# Patient Record
Sex: Male | Born: 1945
Health system: Southern US, Community
[De-identification: ages and names within clinical notes are randomized; demographics above are authoritative.]

## PROBLEM LIST (undated history)

## (undated) DIAGNOSIS — T7840XA Allergy, unspecified, initial encounter: Secondary | ICD-10-CM

## (undated) DIAGNOSIS — B1910 Unspecified viral hepatitis B without hepatic coma: Secondary | ICD-10-CM

## (undated) DIAGNOSIS — H269 Unspecified cataract: Secondary | ICD-10-CM

## (undated) HISTORY — PX: OTHER SURGICAL HISTORY: SHX169

## (undated) HISTORY — DX: Unspecified cataract: H26.9

## (undated) HISTORY — DX: Allergy, unspecified, initial encounter: T78.40XA

## (undated) HISTORY — PX: COLONOSCOPY: SHX174

## (undated) HISTORY — DX: Unspecified viral hepatitis B without hepatic coma: B19.10

## (undated) HISTORY — PX: EYE SURGERY: SHX253

---

## 2004-11-11 ENCOUNTER — Ambulatory Visit: Payer: Self-pay | Admitting: Gastroenterology

## 2004-11-17 ENCOUNTER — Ambulatory Visit: Payer: Self-pay | Admitting: Gastroenterology

## 2005-03-20 ENCOUNTER — Ambulatory Visit: Payer: Self-pay | Admitting: Cardiology

## 2005-04-07 ENCOUNTER — Ambulatory Visit: Payer: Self-pay

## 2008-08-13 ENCOUNTER — Encounter: Admission: RE | Admit: 2008-08-13 | Discharge: 2008-08-13 | Payer: Self-pay | Admitting: Endocrinology

## 2008-12-27 ENCOUNTER — Telehealth (INDEPENDENT_AMBULATORY_CARE_PROVIDER_SITE_OTHER): Payer: Self-pay | Admitting: *Deleted

## 2008-12-31 ENCOUNTER — Encounter: Payer: Self-pay | Admitting: Cardiology

## 2008-12-31 ENCOUNTER — Ambulatory Visit: Payer: Self-pay

## 2009-01-01 ENCOUNTER — Telehealth (INDEPENDENT_AMBULATORY_CARE_PROVIDER_SITE_OTHER): Payer: Self-pay | Admitting: Radiology

## 2009-02-21 DIAGNOSIS — I498 Other specified cardiac arrhythmias: Secondary | ICD-10-CM

## 2009-02-22 ENCOUNTER — Ambulatory Visit: Payer: Self-pay | Admitting: Cardiology

## 2009-02-22 DIAGNOSIS — R079 Chest pain, unspecified: Secondary | ICD-10-CM | POA: Insufficient documentation

## 2009-02-23 ENCOUNTER — Ambulatory Visit: Payer: Self-pay | Admitting: Cardiovascular Disease

## 2009-02-23 ENCOUNTER — Ambulatory Visit (HOSPITAL_COMMUNITY): Admission: RE | Admit: 2009-02-23 | Discharge: 2009-02-23 | Payer: Self-pay | Admitting: Cardiology

## 2009-03-05 LAB — CONVERTED CEMR LAB: CRP: 0.1 mg/dL (ref <0.6)

## 2009-10-01 ENCOUNTER — Encounter (INDEPENDENT_AMBULATORY_CARE_PROVIDER_SITE_OTHER): Payer: Self-pay | Admitting: *Deleted

## 2009-11-01 ENCOUNTER — Encounter (INDEPENDENT_AMBULATORY_CARE_PROVIDER_SITE_OTHER): Payer: Self-pay | Admitting: *Deleted

## 2009-11-14 ENCOUNTER — Encounter (INDEPENDENT_AMBULATORY_CARE_PROVIDER_SITE_OTHER): Payer: Self-pay | Admitting: *Deleted

## 2009-11-15 ENCOUNTER — Ambulatory Visit: Payer: Self-pay | Admitting: Gastroenterology

## 2009-11-22 ENCOUNTER — Ambulatory Visit: Payer: Self-pay | Admitting: Gastroenterology

## 2010-09-30 NOTE — Miscellaneous (Signed)
Summary: previsit/rm  Clinical Lists Changes  Medications: Added new medication of MOVIPREP 100 GM  SOLR (PEG-KCL-NACL-NASULF-NA ASC-C) As per prep instructions. - Signed Rx of MOVIPREP 100 GM  SOLR (PEG-KCL-NACL-NASULF-NA ASC-C) As per prep instructions.;  #1 x 0;  Signed;  Entered by: Sherren Kerns RN;  Authorized by: Mardella Layman MD [facility];  Method used: Electronically to [facility]. [phone]*, [address], [place], [place], [place]  [address], Ph: [phone], Fax: [phone] Observations: Added new observation of ALLERGY REV: Done (11/15/2009 13:04)    Prescriptions: MOVIPREP 100 GM  SOLR (PEG-KCL-NACL-NASULF-NA ASC-C) As per prep instructions.  #1 x 0   Entered by:   Sherren Kerns RN   Authorized by:   Mardella Layman MD [facility]   Signed by:   Sherren Kerns RN on 11/15/2009   Method used:   Electronically to        [facility]. [phone]* (retail)       [address]       [place], [place]  [address]       Ph: [phone]       Fax: [phone]   RxID:   [id number]

## 2010-09-30 NOTE — Procedures (Signed)
Summary: Colonoscopy  Patient: Thomas Balaguer md Note: All result statuses are Final unless otherwise noted.  Tests: (1) Colonoscopy (COL)   COL Colonoscopy           DONE      [facility]     [address]     [place], [place]  [address]           COLONOSCOPY PROCEDURE REPORT           PATIENT:  Thomas Ponce, Thomas Ponce  MR#:  #[id number]     BIRTHDATE:  1946-05-20, 63 yrs. old  GENDER:  male     ENDOSCOPIST:  Vania Rea. Jarold Motto, MD, [facility]     REF. BY:     PROCEDURE DATE:  11/22/2009     PROCEDURE:  Average-risk screening colonoscopy     G0121     ASA CLASS:  Class I     INDICATIONS:  Routine Risk Screening, Elevated Risk Screening     MOTHER WITH COLON CANCER.     MEDICATIONS:   Fentanyl 50 mcg IV, Versed 7 mg IV           DESCRIPTION OF PROCEDURE:   After the risks benefits and     alternatives of the procedure were thoroughly explained, informed     consent was obtained.  Digital rectal exam was performed and     revealed no abnormalities.   The LB [id number] endoscope     was introduced through the anus and advanced to the cecum, which     was identified by both the appendix and ileocecal valve, without     limitations.  The quality of the prep was excellent, using     MoviPrep.  The instrument was then slowly withdrawn as the colon     was fully examined.     <<PROCEDUREIMAGES>>           FINDINGS:  Scattered diverticula were found in the sigmoid colon.     No polyps or cancers were seen.   Retroflexed views in the rectum     revealed no abnormalities.    The scope was then withdrawn from     the patient and the procedure completed.           COMPLICATIONS:  None     ENDOSCOPIC IMPRESSION:     1) Diverticula, scattered in the sigmoid colon     2) No polyps or cancers     RECOMMENDATIONS:     1) high fiber diet     2) Given your significant family history of colon cancer, you     should have a repeat colonoscopy in 5 years     REPEAT EXAM:  No             ______________________________     Vania Rea. Jarold Motto, MD, [facility]           CC:  Corrin Parker, MD           n.     Rosalie Doctor:   Vania Rea. Patterson at 11/22/2009 02:43 PM           Matsushima md, [place], #[id number]  Note: An exclamation mark (!) indicates a result that was not dispersed into the flowsheet. Document Creation Date: 11/22/2009 2:44 PM _______________________________________________________________________  (1) Order result status: Final Collection or observation date-time: 11/22/2009 14:34 Requested date-time:  Receipt date-time:  Reported date-time:  Referring Physician:   Ordering Physician: Sheryn Bison [phone]) Specimen Source:  Source:  Launa Grill Order Number: [id number] Lab site:   Appended Document: Colonoscopy    Clinical Lists Changes  Observations: Added new observation of COLONNXTDUE: 10/2014 (11/22/2009 15:15)

## 2010-09-30 NOTE — Letter (Signed)
Summary: Moviprep Instructions  [facility]  [address]   [place], [place] [address]   Phone: [phone]  Fax: [phone]       Thomas Ponce    [birth year]    MRN: [id number]        Procedure Day Dorna Bloom: 11-22-09, Friday     Arrival Time: 1:30 p.m.      Procedure Time: 2:30 p.m.     Location of Procedure:                    x   [facility] (4th Floor)   PREPARATION FOR COLONOSCOPY WITH MOVIPREP   Starting 5 days prior to your procedure  11-17-09 do not eat nuts, seeds, popcorn, corn, beans, peas,  salads, or any raw vegetables.  Do not take any fiber supplements (e.g. Metamucil, Citrucel, and Benefiber).  THE DAY BEFORE YOUR PROCEDURE         DATE: 11-21-09   DAY: Thursday  1.  Drink clear liquids the entire day-NO SOLID FOOD  2.  Do not drink anything colored red or purple.  Avoid juices with pulp.  No orange juice.  3.  Drink at least 64 oz. (8 glasses) of fluid/clear liquids during the day to prevent dehydration and help the prep work efficiently.  CLEAR LIQUIDS INCLUDE: Water Jello Ice Popsicles Tea (sugar ok, no milk/cream) Powdered fruit flavored drinks Coffee (sugar ok, no milk/cream) Gatorade Juice: apple, white grape, white cranberry  Lemonade Clear bullion, consomm, broth Carbonated beverages (any kind) Strained chicken noodle soup Hard Candy                             4.  In the morning, mix first dose of MoviPrep solution:    Empty 1 Pouch A and 1 Pouch B into the disposable container    Add lukewarm drinking water to the top line of the container. Mix to dissolve    Refrigerate (mixed solution should be used within 24 hrs)  5.  Begin drinking the prep at 5:00 p.m. The MoviPrep container is divided by 4 marks.   Every 15 minutes drink the solution down to the next mark (approximately 8 oz) until the full liter is complete.   6.  Follow completed prep with 16 oz of clear liquid of your choice (Nothing red or  purple).  Continue to drink clear liquids until bedtime.  7.  Before going to bed, mix second dose of MoviPrep solution:    Empty 1 Pouch A and 1 Pouch B into the disposable container    Add lukewarm drinking water to the top line of the container. Mix to dissolve    Refrigerate  THE DAY OF YOUR PROCEDURE      DATE: 11-22-09   DAY: Friday  Beginning at 9:30 a.m. (5 hours before procedure):         1. Every 15 minutes, drink the solution down to the next mark (approx 8 oz) until the full liter is complete.  2. Follow completed prep with 16 oz. of clear liquid of your choice.    3. You may drink clear liquids until 12:30 p.m.  (2 HOURS BEFORE PROCEDURE).   MEDICATION INSTRUCTIONS  Unless otherwise instructed, you should take regular prescription medications with a small sip of water   as early as possible the morning of your procedure.   Additional medication instructions:  n/a  OTHER INSTRUCTIONS  You will need a responsible adult at least 67 years of age to accompany you and drive you home.   This person must remain in the waiting room during your procedure.  Wear loose fitting clothing that is easily removed.  Leave jewelry and other valuables at home.  However, you may wish to bring a book to read or  an iPod/MP3 player to listen to music as you wait for your procedure to start.  Remove all body piercing jewelry and leave at home.  Total time from sign-in until discharge is approximately 2-3 hours.  You should go home directly after your procedure and rest.  You can resume normal activities the  day after your procedure.  The day of your procedure you should not:   Drive   Make legal decisions   Operate machinery   Drink alcohol   Return to work  You will receive specific instructions about eating, activities and medications before you leave.    The above instructions have been reviewed and explained to me by   Sherren Kerns RN  November 15, 2009  1:19 PM     I fully understand and can verbalize these instructions _____________________________ Date _________

## 2010-09-30 NOTE — Letter (Signed)
Summary: Previsit letter  [facility]  [address] [place], [place] [address]   Phone: [phone]  Fax: [phone]       11/01/2009 MRN: [id number]  [place] [address] [place], [place]  [address]  Dear Thomas Ponce,  Welcome to the Gastroenterology Division at [facility].    You are scheduled to see a nurse for your pre-procedure visit on 11-15-09 at 1:00p.m. on the 3rd floor at [facility], [address]  We ask that you try to arrive at our office 15 minutes prior to your appointment time to allow for check-in.  Your nurse visit will consist of discussing your medical and surgical history, your immediate family medical history, and your medications.    Please bring a complete list of all your medications or, if you prefer, bring the medication bottles and we will list them.  We will need to be aware of both prescribed and over the counter drugs.  We will need to know exact dosage information as well.  If you are on blood thinners (Coumadin, Plavix, Aggrenox, Ticlid, etc.) please call our office today/prior to your appointment, as we need to consult with your physician about holding your medication.   Please be prepared to read and sign documents such as consent forms, a financial agreement, and acknowledgement forms.  If necessary, and with your consent, a friend or relative is welcome to sit-in on the nurse visit with you.  Please bring your insurance card so that we may make a copy of it.  If your insurance requires a referral to see a specialist, please bring your referral form from your primary care physician.  No co-pay is required for this nurse visit.     If you cannot keep your appointment, please call [phone] to cancel or reschedule prior to your appointment date.  This allows [place] the opportunity to schedule an appointment for another patient in need of care.    Thank you for choosing [facility] for your medical needs.   We appreciate the opportunity to care for you.  Please visit [place] at our website  to learn more about our practice.                     Sincerely.                                                                                                                   The Gastroenterology Division

## 2010-09-30 NOTE — Letter (Signed)
Summary: Colonoscopy Letter  [facility]  [address] [place], [place] [address]   Phone: [phone]  Fax: [phone]      October 01, 2009 MRN: [id number]   [place] [address] [place], [place]  [address]   Dear Mr. CHARTER,   According to your medical record, it is time for you to schedule a Colonoscopy. The American Cancer Society recommends this procedure as a method to detect early colon cancer. Patients with a family history of colon cancer, or a personal history of colon polyps or inflammatory bowel disease are at increased risk.  This letter has beeen generated based on the recommendations made at the time of your procedure. If you feel that in your particular situation this may no longer apply, please contact our office.  Please call our office at [phone] to schedule this appointment or to update your records at your earliest convenience.  Thank you for cooperating with [place] to provide you with the very best care possible.   Sincerely,  Vania Rea. Jarold Motto, M.D.  [facility] [phone]

## 2011-09-21 ENCOUNTER — Other Ambulatory Visit (INDEPENDENT_AMBULATORY_CARE_PROVIDER_SITE_OTHER): Payer: Self-pay | Admitting: Surgery

## 2011-09-21 NOTE — Telephone Encounter (Signed)
This looks like it is for you. Please review. Thanks.

## 2012-09-02 ENCOUNTER — Encounter (INDEPENDENT_AMBULATORY_CARE_PROVIDER_SITE_OTHER): Payer: Self-pay | Admitting: General Surgery

## 2012-09-06 ENCOUNTER — Encounter (INDEPENDENT_AMBULATORY_CARE_PROVIDER_SITE_OTHER): Payer: Self-pay | Admitting: Surgery

## 2012-09-14 ENCOUNTER — Encounter (INDEPENDENT_AMBULATORY_CARE_PROVIDER_SITE_OTHER): Payer: Self-pay | Admitting: Surgery

## 2012-09-16 ENCOUNTER — Ambulatory Visit (INDEPENDENT_AMBULATORY_CARE_PROVIDER_SITE_OTHER): Payer: Self-pay | Admitting: General Surgery

## 2012-09-16 ENCOUNTER — Encounter (INDEPENDENT_AMBULATORY_CARE_PROVIDER_SITE_OTHER): Payer: Self-pay | Admitting: Surgery

## 2012-10-11 ENCOUNTER — Encounter (INDEPENDENT_AMBULATORY_CARE_PROVIDER_SITE_OTHER): Payer: Self-pay | Admitting: Surgery

## 2012-10-12 ENCOUNTER — Ambulatory Visit (INDEPENDENT_AMBULATORY_CARE_PROVIDER_SITE_OTHER): Payer: Self-pay | Admitting: Surgery

## 2012-10-13 ENCOUNTER — Other Ambulatory Visit (INDEPENDENT_AMBULATORY_CARE_PROVIDER_SITE_OTHER): Payer: Self-pay | Admitting: General Surgery

## 2012-10-13 MED ORDER — AZITHROMYCIN 250 MG PO TABS: ORAL_TABLET | ORAL | Status: AC

## 2012-10-13 NOTE — Progress Notes (Signed)
Pt seen and examined Signs and symptoms c/w sinusitis  Thomas Ponce. Andrey Campanile, MD, FACS General, Bariatric, & Minimally Invasive Surgery [facility], [place]

## 2013-04-05 ENCOUNTER — Other Ambulatory Visit: Payer: Self-pay

## 2013-04-22 ENCOUNTER — Encounter (INDEPENDENT_AMBULATORY_CARE_PROVIDER_SITE_OTHER): Payer: Self-pay | Admitting: Surgery

## 2013-06-06 ENCOUNTER — Other Ambulatory Visit (INDEPENDENT_AMBULATORY_CARE_PROVIDER_SITE_OTHER): Payer: Self-pay

## 2013-06-06 DIAGNOSIS — T880XXD Infection following immunization, subsequent encounter: Secondary | ICD-10-CM

## 2013-06-06 MED ORDER — INFLUENZA VAC TYP A&B SURF ANT IM INJ: 0.5000 mL | INJECTION | Freq: Once | INTRAMUSCULAR | Status: DC

## 2013-07-06 ENCOUNTER — Other Ambulatory Visit: Payer: Self-pay

## 2013-08-28 ENCOUNTER — Encounter (INDEPENDENT_AMBULATORY_CARE_PROVIDER_SITE_OTHER): Payer: Self-pay | Admitting: General Surgery

## 2013-08-28 ENCOUNTER — Ambulatory Visit (INDEPENDENT_AMBULATORY_CARE_PROVIDER_SITE_OTHER): Payer: Self-pay | Admitting: General Surgery

## 2013-08-28 DIAGNOSIS — Z7189 Other specified counseling: Secondary | ICD-10-CM

## 2013-08-28 NOTE — Progress Notes (Signed)
Patient ID: Thomas MANERS Md, male   DOB: 1946-01-09, 67 y.o.   MRN: [id number] We discussed immunization status

## 2013-09-01 DIAGNOSIS — R972 Elevated prostate specific antigen [PSA]: Secondary | ICD-10-CM | POA: Diagnosis not present

## 2013-09-01 DIAGNOSIS — N139 Obstructive and reflux uropathy, unspecified: Secondary | ICD-10-CM | POA: Diagnosis not present

## 2013-09-01 DIAGNOSIS — N401 Enlarged prostate with lower urinary tract symptoms: Secondary | ICD-10-CM | POA: Diagnosis not present

## 2014-04-10 DIAGNOSIS — R7301 Impaired fasting glucose: Secondary | ICD-10-CM | POA: Diagnosis not present

## 2014-04-10 DIAGNOSIS — M899 Disorder of bone, unspecified: Secondary | ICD-10-CM | POA: Diagnosis not present

## 2014-04-10 DIAGNOSIS — Z125 Encounter for screening for malignant neoplasm of prostate: Secondary | ICD-10-CM | POA: Diagnosis not present

## 2014-04-10 DIAGNOSIS — R972 Elevated prostate specific antigen [PSA]: Secondary | ICD-10-CM | POA: Diagnosis not present

## 2014-04-10 DIAGNOSIS — Z79899 Other long term (current) drug therapy: Secondary | ICD-10-CM | POA: Diagnosis not present

## 2014-04-10 DIAGNOSIS — E785 Hyperlipidemia, unspecified: Secondary | ICD-10-CM | POA: Diagnosis not present

## 2014-04-10 DIAGNOSIS — M949 Disorder of cartilage, unspecified: Secondary | ICD-10-CM | POA: Diagnosis not present

## 2014-04-17 DIAGNOSIS — H9190 Unspecified hearing loss, unspecified ear: Secondary | ICD-10-CM | POA: Diagnosis not present

## 2014-04-17 DIAGNOSIS — R7301 Impaired fasting glucose: Secondary | ICD-10-CM | POA: Diagnosis not present

## 2014-04-17 DIAGNOSIS — E785 Hyperlipidemia, unspecified: Secondary | ICD-10-CM | POA: Diagnosis not present

## 2014-04-17 DIAGNOSIS — Z0000 Encounter for general adult medical examination without abnormal findings: Secondary | ICD-10-CM | POA: Diagnosis not present

## 2014-04-17 DIAGNOSIS — Z125 Encounter for screening for malignant neoplasm of prostate: Secondary | ICD-10-CM | POA: Diagnosis not present

## 2014-04-17 DIAGNOSIS — M899 Disorder of bone, unspecified: Secondary | ICD-10-CM | POA: Diagnosis not present

## 2014-04-17 DIAGNOSIS — R972 Elevated prostate specific antigen [PSA]: Secondary | ICD-10-CM | POA: Diagnosis not present

## 2014-04-17 DIAGNOSIS — Z1331 Encounter for screening for depression: Secondary | ICD-10-CM | POA: Diagnosis not present

## 2014-04-17 DIAGNOSIS — M949 Disorder of cartilage, unspecified: Secondary | ICD-10-CM | POA: Diagnosis not present

## 2014-04-17 DIAGNOSIS — IMO0002 Reserved for concepts with insufficient information to code with codable children: Secondary | ICD-10-CM | POA: Diagnosis not present

## 2014-04-19 DIAGNOSIS — Z1212 Encounter for screening for malignant neoplasm of rectum: Secondary | ICD-10-CM | POA: Diagnosis not present

## 2014-04-24 DIAGNOSIS — Z23 Encounter for immunization: Secondary | ICD-10-CM | POA: Diagnosis not present

## 2014-05-23 DIAGNOSIS — Z23 Encounter for immunization: Secondary | ICD-10-CM | POA: Diagnosis not present

## 2014-05-24 DIAGNOSIS — Z961 Presence of intraocular lens: Secondary | ICD-10-CM | POA: Diagnosis not present

## 2014-05-24 DIAGNOSIS — H26499 Other secondary cataract, unspecified eye: Secondary | ICD-10-CM | POA: Diagnosis not present

## 2014-05-24 DIAGNOSIS — H40019 Open angle with borderline findings, low risk, unspecified eye: Secondary | ICD-10-CM | POA: Diagnosis not present

## 2014-07-11 DIAGNOSIS — R972 Elevated prostate specific antigen [PSA]: Secondary | ICD-10-CM | POA: Diagnosis not present

## 2014-09-18 DIAGNOSIS — R972 Elevated prostate specific antigen [PSA]: Secondary | ICD-10-CM | POA: Diagnosis not present

## 2014-09-25 DIAGNOSIS — N401 Enlarged prostate with lower urinary tract symptoms: Secondary | ICD-10-CM | POA: Diagnosis not present

## 2014-09-25 DIAGNOSIS — N529 Male erectile dysfunction, unspecified: Secondary | ICD-10-CM | POA: Diagnosis not present

## 2014-09-25 DIAGNOSIS — N138 Other obstructive and reflux uropathy: Secondary | ICD-10-CM | POA: Diagnosis not present

## 2014-09-25 DIAGNOSIS — R972 Elevated prostate specific antigen [PSA]: Secondary | ICD-10-CM | POA: Diagnosis not present

## 2014-12-25 ENCOUNTER — Telehealth: Payer: Self-pay | Admitting: Internal Medicine

## 2014-12-25 DIAGNOSIS — Z1211 Encounter for screening for malignant neoplasm of colon: Secondary | ICD-10-CM

## 2014-12-25 NOTE — Telephone Encounter (Signed)
Saw Dr. Margot Chimes and we spoke and he said office was trying to contact him about scheduling a colonoscopy.  He thought someone was calling for me. Do not see any calls in chart about this nor do I see a recall letter.  Please investigate and find out - DRP had indicated recall 10/2014 - FHx [facility] but I do not see details of that

## 2014-12-26 NOTE — Telephone Encounter (Signed)
Left message on machine to call back regarding scheduling the screening colon and family history of colon cancer

## 2014-12-26 NOTE — Telephone Encounter (Signed)
Cell phone number [phone]

## 2014-12-27 ENCOUNTER — Encounter: Payer: Self-pay | Admitting: Gastroenterology

## 2014-12-31 DIAGNOSIS — R609 Edema, unspecified: Secondary | ICD-10-CM | POA: Diagnosis not present

## 2014-12-31 DIAGNOSIS — B1910 Unspecified viral hepatitis B without hepatic coma: Secondary | ICD-10-CM | POA: Diagnosis not present

## 2014-12-31 DIAGNOSIS — Z6826 Body mass index (BMI) 26.0-26.9, adult: Secondary | ICD-10-CM | POA: Diagnosis not present

## 2015-01-01 ENCOUNTER — Other Ambulatory Visit (HOSPITAL_COMMUNITY): Payer: Self-pay | Admitting: Internal Medicine

## 2015-01-01 ENCOUNTER — Ambulatory Visit (HOSPITAL_COMMUNITY)
Admission: RE | Admit: 2015-01-01 | Discharge: 2015-01-01 | Disposition: A | Discharge status: Home / Self Care | Payer: Medicare Other | Source: Ambulatory Visit | Attending: Vascular Surgery | Admitting: Vascular Surgery

## 2015-01-01 DIAGNOSIS — R609 Edema, unspecified: Secondary | ICD-10-CM | POA: Insufficient documentation

## 2015-01-01 MED ORDER — MOVIPREP 100 G PO SOLR: 1.0000 | Freq: Once | ORAL | Status: DC

## 2015-01-01 NOTE — Telephone Encounter (Signed)
I spoke with Dr Margot Chimes and he has been sch for colon on 01/14/15 130 pm, he will come by the office tomorrow morning and sign the paperwork and get instructions.  Paperwork has been left at the front desk.

## 2015-01-01 NOTE — Telephone Encounter (Signed)
Dr Margot Chimes returned call and left a voice mail message to return call at [phone]

## 2015-01-01 NOTE — Progress Notes (Signed)
Preliminary report phoned to [place] and given to [place].

## 2015-01-02 DIAGNOSIS — E785 Hyperlipidemia, unspecified: Secondary | ICD-10-CM | POA: Diagnosis not present

## 2015-01-14 ENCOUNTER — Other Ambulatory Visit: Payer: Self-pay | Admitting: Gastroenterology

## 2015-01-14 ENCOUNTER — Encounter: Payer: Self-pay | Admitting: Gastroenterology

## 2015-01-14 ENCOUNTER — Ambulatory Visit (AMBULATORY_SURGERY_CENTER): Payer: Medicare Other | Admitting: Gastroenterology

## 2015-01-14 VITALS — BP 130/70 | HR 54 | Temp 96.1°F | Resp 18 | Ht 72.0 in | Wt 185.0 lb

## 2015-01-14 DIAGNOSIS — Z1211 Encounter for screening for malignant neoplasm of colon: Secondary | ICD-10-CM

## 2015-01-14 DIAGNOSIS — D123 Benign neoplasm of transverse colon: Secondary | ICD-10-CM

## 2015-01-14 DIAGNOSIS — D122 Benign neoplasm of ascending colon: Secondary | ICD-10-CM

## 2015-01-14 DIAGNOSIS — N400 Enlarged prostate without lower urinary tract symptoms: Secondary | ICD-10-CM | POA: Diagnosis not present

## 2015-01-14 DIAGNOSIS — K5730 Diverticulosis of large intestine without perforation or abscess without bleeding: Secondary | ICD-10-CM

## 2015-01-14 MED ORDER — SODIUM CHLORIDE 0.9 % IV SOLN: 500.0000 mL | INTRAVENOUS | Status: DC

## 2015-01-14 NOTE — Progress Notes (Signed)
Report to PACU, RN, vss, BBS= Clear.  

## 2015-01-14 NOTE — Op Note (Signed)
[facility]  [address] [place], [address]   COLONOSCOPY PROCEDURE REPORT  PATIENT: Thomas Ponce, Thomas Ponce  MR#: [id number] BIRTHDATE: 08/08/1946 , 51  yrs. old GENDER: male ENDOSCOPIST: Milus Banister, MD PROCEDURE DATE:  01/14/2015 PROCEDURE:   Colonoscopy, screening and Colonoscopy with snare polypectomy First Screening Colonoscopy - Avg.  risk and is 50 yrs.  old or older - No.  Prior Negative Screening - Now for repeat screening. N/A  History of Adenoma - Now for follow-up colonoscopy & has been > or = to 3 yrs.  N/A  Polyps removed today? Yes ASA CLASS:   Class II INDICATIONS:Screening for colonic neoplasia. MEDICATIONS: Monitored anesthesia care and Propofol 230 mg IV  DESCRIPTION OF PROCEDURE:   After the risks benefits and alternatives of the procedure were thoroughly explained, informed consent was obtained.  The digital rectal exam revealed no abnormalities of the rectum.   The LB [id number]  endoscope was introduced through the anus and advanced to the cecum, which was identified by both the appendix and ileocecal valve. No adverse events experienced.   The quality of the prep was excellent.  The instrument was then slowly withdrawn as the colon was fully examined.  COLON FINDINGS: Two sessile polyps ranging between 3-60mm in size were found in the transverse colon and ascending colon. Polypectomies were performed with a cold snare.  The resection was complete, the polyp tissue was completely retrieved and sent to histology.   There was mild diverticulosis noted in the left colon. The examination was otherwise normal.  Retroflexed views revealed no abnormalities. The time to cecum = 3.6 Withdrawal time = 8.1 The scope was withdrawn and the procedure completed. COMPLICATIONS: There were no immediate complications.  ENDOSCOPIC IMPRESSION: 1.   Two polyps were found, removed and sent to pathology 2.   Mild diverticulosis was noted in the  left colon 3.   The examination was otherwise normal  RECOMMENDATIONS: If the polyp(s) removed today are proven to be adenomatous (pre-cancerous) polyps, you will need a repeat colonoscopy in 5 years.  You will receive a letter within 1-2 weeks with the results of your biopsy as well as final recommendations.  Please call my office if you have not received a letter after 3 weeks.  eSigned:  Milus Banister, MD 01/14/2015 1:41 PM   cc: Crist Infante, MD

## 2015-01-14 NOTE — Patient Instructions (Signed)
YOU HAD AN ENDOSCOPIC PROCEDURE TODAY AT [facility]:   Refer to the procedure report that was given to you for any specific questions about what was found during the examination.  If the procedure report does not answer your questions, please call your gastroenterologist to clarify.  If you requested that your care partner not be given the details of your procedure findings, then the procedure report has been included in a sealed envelope for you to review at your convenience later.  YOU SHOULD EXPECT: Some feelings of bloating in the abdomen. Passage of more gas than usual.  Walking can help get rid of the air that was put into your GI tract during the procedure and reduce the bloating. If you had a lower endoscopy (such as a colonoscopy or flexible sigmoidoscopy) you may notice spotting of blood in your stool or on the toilet paper. If you underwent a bowel prep for your procedure, you may not have a normal bowel movement for a few days.  Please Note:  You might notice some irritation and congestion in your nose or some drainage.  This is from the oxygen used during your procedure.  There is no need for concern and it should clear up in a day or so.  SYMPTOMS TO REPORT IMMEDIATELY:   Following lower endoscopy (colonoscopy or flexible sigmoidoscopy):  Excessive amounts of blood in the stool  Significant tenderness or worsening of abdominal pains  Swelling of the abdomen that is new, acute  Fever of 100F or higher   For urgent or emergent issues, a gastroenterologist can be reached at any hour by calling [phone].   DIET: Your first meal following the procedure should be a small meal and then it is ok to progress to your normal diet. Heavy or fried foods are harder to digest and may make you feel nauseous or bloated.  Likewise, meals heavy in dairy and vegetables can increase bloating.  Drink plenty of fluids but you should avoid alcoholic beverages for 24  hours.  ACTIVITY:  You should plan to take it easy for the rest of today and you should NOT DRIVE or use heavy machinery until tomorrow (because of the sedation medicines used during the test).    FOLLOW UP: Our staff will call the number listed on your records the next business day following your procedure to check on you and address any questions or concerns that you may have regarding the information given to you following your procedure. If we do not reach you, we will leave a message.  However, if you are feeling well and you are not experiencing any problems, there is no need to return our call.  We will assume that you have returned to your regular daily activities without incident.  If any biopsies were taken you will be contacted by phone or by letter within the next 1-3 weeks.  Please call us at [phone] if you have not heard about the biopsies in 3 weeks.    SIGNATURES/CONFIDENTIALITY: You and/or your care partner have signed paperwork which will be entered into your electronic medical record.  These signatures attest to the fact that that the information above on your After Visit Summary has been reviewed and is understood.  Full responsibility of the confidentiality of this discharge information lies with you and/or your care-partner.  Polyps, diverticulosis, high fiber diet-handouts given  Repeat colonoscopy will be determined by pathology.

## 2015-01-14 NOTE — Progress Notes (Signed)
Called to room to assist during endoscopic procedure.  Patient ID and intended procedure confirmed with present staff. Received instructions for my participation in the procedure from the performing physician.  

## 2015-01-15 ENCOUNTER — Telehealth: Payer: Self-pay | Admitting: *Deleted

## 2015-01-15 NOTE — Telephone Encounter (Signed)
  Follow up Call-  Call back number 01/14/2015  Post procedure Call Back phone  # [phone] hm  Permission to leave phone message Yes     Patient questions:  Do you have a fever, pain , or abdominal swelling? No. Pain Score  0 *  Have you tolerated food without any problems? Yes.    Have you been able to return to your normal activities? Yes.    Do you have any questions about your discharge instructions: Diet   No. Medications  No. Follow up visit  No.  Do you have questions or concerns about your Care? No.  Actions: * If pain score is 4 or above: No action needed, pain <4.

## 2015-01-17 ENCOUNTER — Encounter: Payer: Self-pay | Admitting: Gastroenterology

## 2015-01-27 ENCOUNTER — Ambulatory Visit (INDEPENDENT_AMBULATORY_CARE_PROVIDER_SITE_OTHER): Payer: Medicare Other | Admitting: Urgent Care

## 2015-01-27 VITALS — BP 142/78 | HR 63 | Temp 98.4°F | Ht 72.25 in | Wt 190.1 lb

## 2015-01-27 DIAGNOSIS — H109 Unspecified conjunctivitis: Secondary | ICD-10-CM

## 2015-01-27 DIAGNOSIS — H1089 Other conjunctivitis: Secondary | ICD-10-CM

## 2015-01-27 DIAGNOSIS — A499 Bacterial infection, unspecified: Secondary | ICD-10-CM

## 2015-01-27 MED ORDER — OFLOXACIN 0.3 % OP SOLN: 1.0000 [drp] | Freq: Four times a day (QID) | OPHTHALMIC | Status: AC

## 2015-01-27 NOTE — Progress Notes (Signed)
    MRN: [id number] DOB: 1946-05-03  Subjective:   Thomas COWIN Md is a 69 y.o. male presenting for chief complaint of Conjunctivitis  Reports 2 day history of worsening right red eye. Woke up this morning with matted right eye, ongoing purulent discharge. Has not tried medications for relief. Denies fevers, eye pain, itchy eyes, sinus headache, sinus pain, sinus congestion, ear pain, ear drainage, tooth pain, cough. Denies eye trauma. Wears glasses, no contacts. Denies any other aggravating or relieving factors, no other questions or concerns.  Thomas Ponce has a current medication list which includes the following prescription(s): atorvastatin, fluticasone, tamsulosin, and viagra. He has No Known Allergies.  Thomas Ponce  has a past medical history of Allergy; Cataract; and Hepatitis B. Also  has past surgical history that includes tonillectomy and adnoidectomy; bilateral cateracts removal; Colonoscopy; and Eye surgery.  ROS As in subjective.  Objective:   Vitals: BP 142/78 mmHg  Pulse 63  Temp(Src) 98.4 F (36.9 C) (Oral)  Ht 6' 0.25" (1.835 m)  Wt 190 lb 2 oz (86.24 kg)  BMI 25.61 kg/m2  SpO2 98%  Physical Exam  Constitutional: He appears well-developed and well-nourished.  HENT:  TM's intact bilaterally, no effusions or erythema. Nares patent, nasal turbinates pink and moist. No sinus tenderness. Oropharynx clear, mucous membranes moist, dentition in good repair.  Eyes: EOM are normal. Pupils are equal, round, and reactive to light. Right eye exhibits discharge (significant purulent discharge with associated injected conjunctiva). Left eye exhibits no discharge. No scleral icterus.  Neck: Normal range of motion. Neck supple.  Cardiovascular: Normal rate.   Pulmonary/Chest: Effort normal. No stridor.  Lymphadenopathy:    He has no cervical adenopathy.  Skin: Skin is warm and dry. No rash noted. No erythema. No pallor.   Assessment and Plan :   1. Bacterial  conjunctivitis of right eye - Start Ocuflox x1 week, rtc if no improvement in 2 days otherwise as needed  Jaynee Eagles, PA-C [facility] [phone] 01/27/2015 12:27 PM

## 2015-01-27 NOTE — Patient Instructions (Addendum)
Day 1-2: Use 1 eye drop every 2-4 hours. Day 3-7: Use 1 eye drop 4 times daily.  Bacterial Conjunctivitis Bacterial conjunctivitis, commonly called pink eye, is an inflammation of the clear membrane that covers the white part of the eye (conjunctiva). The inflammation can also happen on the underside of the eyelids. The blood vessels in the conjunctiva become inflamed, causing the eye to become red or pink. Bacterial conjunctivitis may spread easily from one eye to another and from person to person (contagious).  CAUSES  Bacterial conjunctivitis is caused by bacteria. The bacteria may come from your own skin, your upper respiratory tract, or from someone else with bacterial conjunctivitis. SYMPTOMS  The normally white color of the eye or the underside of the eyelid is usually pink or red. The pink eye is usually associated with irritation, tearing, and some sensitivity to light. Bacterial conjunctivitis is often associated with a thick, yellowish discharge from the eye. The discharge may turn into a crust on the eyelids overnight, which causes your eyelids to stick together. If a discharge is present, there may also be some blurred vision in the affected eye. DIAGNOSIS  Bacterial conjunctivitis is diagnosed by your caregiver through an eye exam and the symptoms that you report. Your caregiver looks for changes in the surface tissues of your eyes, which may point to the specific type of conjunctivitis. A sample of any discharge may be collected on a cotton-tip swab if you have a severe case of conjunctivitis, if your cornea is affected, or if you keep getting repeat infections that do not respond to treatment. The sample will be sent to a lab to see if the inflammation is caused by a bacterial infection and to see if the infection will respond to antibiotic medicines. TREATMENT   Bacterial conjunctivitis is treated with antibiotics. Antibiotic eyedrops are most often used. However, antibiotic ointments  are also available. Antibiotics pills are sometimes used. Artificial tears or eye washes may ease discomfort. HOME CARE INSTRUCTIONS   To ease discomfort, apply a cool, clean washcloth to your eye for 10-20 minutes, 3-4 times a day.  Gently wipe away any drainage from your eye with a warm, wet washcloth or a cotton ball.  Wash your hands often with soap and water. Use paper towels to dry your hands.  Do not share towels or washcloths. This may spread the infection.  Change or wash your pillowcase every day.  You should not use eye makeup until the infection is gone.  Do not operate machinery or drive if your vision is blurred.  Stop using contact lenses. Ask your caregiver how to sterilize or replace your contacts before using them again. This depends on the type of contact lenses that you use.  When applying medicine to the infected eye, do not touch the edge of your eyelid with the eyedrop bottle or ointment tube. SEEK IMMEDIATE MEDICAL CARE IF:   Your infection has not improved within 3 days after beginning treatment.  You had yellow discharge from your eye and it returns.  You have increased eye pain.  Your eye redness is spreading.  Your vision becomes blurred.  You have a fever or persistent symptoms for more than 2-3 days.  You have a fever and your symptoms suddenly get worse.  You have facial pain, redness, or swelling. MAKE SURE YOU:   Understand these instructions.  Will watch your condition.  Will get help right away if you are not doing well or get worse. Document Released:  08/17/2005 Document Revised: 01/01/2014 Document Reviewed: 01/18/2012 [facility] Patient Information 2015 [place], [place]. This information is not intended to replace advice given to you by your health care provider. Make sure you discuss any questions you have with your health care provider.

## 2015-04-15 ENCOUNTER — Encounter: Payer: Self-pay | Admitting: Gastroenterology

## 2015-05-07 DIAGNOSIS — E785 Hyperlipidemia, unspecified: Secondary | ICD-10-CM | POA: Diagnosis not present

## 2015-05-07 DIAGNOSIS — Z125 Encounter for screening for malignant neoplasm of prostate: Secondary | ICD-10-CM | POA: Diagnosis not present

## 2015-05-07 DIAGNOSIS — M859 Disorder of bone density and structure, unspecified: Secondary | ICD-10-CM | POA: Diagnosis not present

## 2015-05-07 DIAGNOSIS — R7301 Impaired fasting glucose: Secondary | ICD-10-CM | POA: Diagnosis not present

## 2015-05-21 DIAGNOSIS — Z1212 Encounter for screening for malignant neoplasm of rectum: Secondary | ICD-10-CM | POA: Diagnosis not present

## 2015-05-28 DIAGNOSIS — H04123 Dry eye syndrome of bilateral lacrimal glands: Secondary | ICD-10-CM | POA: Diagnosis not present

## 2015-05-28 DIAGNOSIS — H26493 Other secondary cataract, bilateral: Secondary | ICD-10-CM | POA: Diagnosis not present

## 2015-05-28 DIAGNOSIS — M859 Disorder of bone density and structure, unspecified: Secondary | ICD-10-CM | POA: Diagnosis not present

## 2015-05-28 DIAGNOSIS — Z1389 Encounter for screening for other disorder: Secondary | ICD-10-CM | POA: Diagnosis not present

## 2015-05-28 DIAGNOSIS — R7301 Impaired fasting glucose: Secondary | ICD-10-CM | POA: Diagnosis not present

## 2015-05-28 DIAGNOSIS — Z0000 Encounter for general adult medical examination without abnormal findings: Secondary | ICD-10-CM | POA: Diagnosis not present

## 2015-05-28 DIAGNOSIS — H9190 Unspecified hearing loss, unspecified ear: Secondary | ICD-10-CM | POA: Diagnosis not present

## 2015-05-28 DIAGNOSIS — R609 Edema, unspecified: Secondary | ICD-10-CM | POA: Diagnosis not present

## 2015-05-28 DIAGNOSIS — E785 Hyperlipidemia, unspecified: Secondary | ICD-10-CM | POA: Diagnosis not present

## 2015-05-28 DIAGNOSIS — R972 Elevated prostate specific antigen [PSA]: Secondary | ICD-10-CM | POA: Diagnosis not present

## 2015-05-28 DIAGNOSIS — Z6824 Body mass index (BMI) 24.0-24.9, adult: Secondary | ICD-10-CM | POA: Diagnosis not present

## 2015-05-28 DIAGNOSIS — Z961 Presence of intraocular lens: Secondary | ICD-10-CM | POA: Diagnosis not present

## 2015-05-28 DIAGNOSIS — B1910 Unspecified viral hepatitis B without hepatic coma: Secondary | ICD-10-CM | POA: Diagnosis not present

## 2015-05-28 DIAGNOSIS — Z23 Encounter for immunization: Secondary | ICD-10-CM | POA: Diagnosis not present

## 2015-06-05 DIAGNOSIS — H26492 Other secondary cataract, left eye: Secondary | ICD-10-CM | POA: Diagnosis not present

## 2015-07-30 DIAGNOSIS — H903 Sensorineural hearing loss, bilateral: Secondary | ICD-10-CM | POA: Diagnosis not present

## 2015-09-13 DIAGNOSIS — R972 Elevated prostate specific antigen [PSA]: Secondary | ICD-10-CM | POA: Diagnosis not present

## 2015-09-20 DIAGNOSIS — N138 Other obstructive and reflux uropathy: Secondary | ICD-10-CM | POA: Diagnosis not present

## 2015-09-20 DIAGNOSIS — N401 Enlarged prostate with lower urinary tract symptoms: Secondary | ICD-10-CM | POA: Diagnosis not present

## 2015-09-20 DIAGNOSIS — N5201 Erectile dysfunction due to arterial insufficiency: Secondary | ICD-10-CM | POA: Diagnosis not present

## 2015-09-20 DIAGNOSIS — Z0000 Encounter for general adult medical examination without abnormal findings: Secondary | ICD-10-CM | POA: Diagnosis not present

## 2015-09-20 DIAGNOSIS — R972 Elevated prostate specific antigen [PSA]: Secondary | ICD-10-CM | POA: Diagnosis not present

## 2015-10-01 DIAGNOSIS — M859 Disorder of bone density and structure, unspecified: Secondary | ICD-10-CM | POA: Diagnosis not present

## 2016-06-09 DIAGNOSIS — Z125 Encounter for screening for malignant neoplasm of prostate: Secondary | ICD-10-CM | POA: Diagnosis not present

## 2016-06-09 DIAGNOSIS — M859 Disorder of bone density and structure, unspecified: Secondary | ICD-10-CM | POA: Diagnosis not present

## 2016-06-09 DIAGNOSIS — E784 Other hyperlipidemia: Secondary | ICD-10-CM | POA: Diagnosis not present

## 2016-06-09 DIAGNOSIS — R7301 Impaired fasting glucose: Secondary | ICD-10-CM | POA: Diagnosis not present

## 2016-06-17 DIAGNOSIS — B1910 Unspecified viral hepatitis B without hepatic coma: Secondary | ICD-10-CM | POA: Diagnosis not present

## 2016-06-17 DIAGNOSIS — M859 Disorder of bone density and structure, unspecified: Secondary | ICD-10-CM | POA: Diagnosis not present

## 2016-06-17 DIAGNOSIS — Z0000 Encounter for general adult medical examination without abnormal findings: Secondary | ICD-10-CM | POA: Diagnosis not present

## 2016-06-17 DIAGNOSIS — H6123 Impacted cerumen, bilateral: Secondary | ICD-10-CM | POA: Diagnosis not present

## 2016-06-17 DIAGNOSIS — R7301 Impaired fasting glucose: Secondary | ICD-10-CM | POA: Diagnosis not present

## 2016-06-17 DIAGNOSIS — R972 Elevated prostate specific antigen [PSA]: Secondary | ICD-10-CM | POA: Diagnosis not present

## 2016-06-17 DIAGNOSIS — Z6825 Body mass index (BMI) 25.0-25.9, adult: Secondary | ICD-10-CM | POA: Diagnosis not present

## 2016-06-17 DIAGNOSIS — R600 Localized edema: Secondary | ICD-10-CM | POA: Diagnosis not present

## 2016-06-17 DIAGNOSIS — H9193 Unspecified hearing loss, bilateral: Secondary | ICD-10-CM | POA: Diagnosis not present

## 2016-06-17 DIAGNOSIS — E784 Other hyperlipidemia: Secondary | ICD-10-CM | POA: Diagnosis not present

## 2016-06-17 DIAGNOSIS — Z23 Encounter for immunization: Secondary | ICD-10-CM | POA: Diagnosis not present

## 2016-06-17 DIAGNOSIS — Z1389 Encounter for screening for other disorder: Secondary | ICD-10-CM | POA: Diagnosis not present

## 2016-06-19 DIAGNOSIS — Z1212 Encounter for screening for malignant neoplasm of rectum: Secondary | ICD-10-CM | POA: Diagnosis not present

## 2016-11-30 DIAGNOSIS — M79676 Pain in unspecified toe(s): Secondary | ICD-10-CM | POA: Diagnosis not present

## 2016-11-30 DIAGNOSIS — Z6825 Body mass index (BMI) 25.0-25.9, adult: Secondary | ICD-10-CM | POA: Diagnosis not present

## 2016-12-04 DIAGNOSIS — R972 Elevated prostate specific antigen [PSA]: Secondary | ICD-10-CM | POA: Diagnosis not present

## 2016-12-11 DIAGNOSIS — R3912 Poor urinary stream: Secondary | ICD-10-CM | POA: Diagnosis not present

## 2016-12-11 DIAGNOSIS — R3911 Hesitancy of micturition: Secondary | ICD-10-CM | POA: Diagnosis not present

## 2016-12-11 DIAGNOSIS — N5201 Erectile dysfunction due to arterial insufficiency: Secondary | ICD-10-CM | POA: Diagnosis not present

## 2016-12-11 DIAGNOSIS — R972 Elevated prostate specific antigen [PSA]: Secondary | ICD-10-CM | POA: Diagnosis not present

## 2016-12-11 DIAGNOSIS — N401 Enlarged prostate with lower urinary tract symptoms: Secondary | ICD-10-CM | POA: Diagnosis not present

## 2017-05-29 DIAGNOSIS — Z23 Encounter for immunization: Secondary | ICD-10-CM | POA: Diagnosis not present

## 2017-06-24 DIAGNOSIS — Z961 Presence of intraocular lens: Secondary | ICD-10-CM | POA: Diagnosis not present

## 2017-07-25 DIAGNOSIS — I4891 Unspecified atrial fibrillation: Secondary | ICD-10-CM | POA: Diagnosis not present

## 2017-07-25 DIAGNOSIS — Z7901 Long term (current) use of anticoagulants: Secondary | ICD-10-CM | POA: Diagnosis not present

## 2017-07-25 DIAGNOSIS — R030 Elevated blood-pressure reading, without diagnosis of hypertension: Secondary | ICD-10-CM | POA: Diagnosis not present

## 2017-07-25 DIAGNOSIS — R079 Chest pain, unspecified: Secondary | ICD-10-CM | POA: Diagnosis not present

## 2017-07-25 DIAGNOSIS — R002 Palpitations: Secondary | ICD-10-CM | POA: Diagnosis not present

## 2017-07-29 NOTE — Progress Notes (Signed)
Cardiology Office Note   Date:  07/30/2017   ID:  [place], DOB [birth year], MRN [id number]  PCP:  Crist Infante, Ponce  Cardiologist:   Minus Breeding, Ponce  Referring:  Crist Infante, Ponce  Chief Complaint  Patient presents with  . Atrial Fibrillation      History of Present Illness: Thomas Ponce is a 71 y.o. male who is referred by Crist Infante, Ponce for evaluation of atrial fib.  Recently hospitalized [facility] with atrial fibrillation with a rapid rate.  He had rate control with beta-blockers and then converted spontaneously to sinus rhythm.  I reviewed these records.  There were no other acute EKG changes.  Cardiac enzymes were negative.  He noticed this while he was sitting on the couch watching a football game.  The pelvis part beating irregularly and rapidly.  He did not have chest discomfort, neck or arm discomfort.  He had no significant shortness of breath, PND or orthopnea.  He never had these symptoms before.  This was different than the PACs he had in the past.  He did see Dr. Olevia Perches years ago and had echo and stress tests.  He is otherwise active walking.  He denies any cardiovascular symptoms.  He denies shortness of breath, PND or orthopnea.  He drinks a couple glasses of wine some nights.  Discussed this out any cut out his caffeine.  He was sent home on Eliquis and metoprolol low dose.   Past Medical History:  Diagnosis Date  . Allergy   . Cataract    bil cateracts removed  . Hepatitis B    1976    Past Surgical History:  Procedure Laterality Date  . bilateral cateracts removal    . COLONOSCOPY    . EYE SURGERY    . tonillectomy and adnoidectomy       Current Outpatient Medications  Medication Sig Dispense Refill  . atorvastatin (LIPITOR) 10 MG tablet Take 10 mg by mouth daily.    . fluticasone (FLONASE) 50 MCG/ACT nasal spray Place into both nostrils daily.    . tamsulosin (FLOMAX) 0.4 MG CAPS capsule Take 0.4 mg by mouth.    Marland Kitchen VIAGRA  100 MG tablet See admin instructions.  5   No current facility-administered medications for this visit.     Allergies:   Patient has no known allergies.    Social History:  The patient  reports that  has never smoked. he has never used smokeless tobacco. He reports that he drinks about 6.0 oz of alcohol per week. He reports that he does not use drugs.   Family History:  The patient's family history includes Heart disease in his father and mother; Hypertension in his father and mother; Ovarian cancer in his maternal aunt; Stomach cancer (age of onset: 68) in his cousin; Uterine cancer in his maternal aunt.    ROS:  Please see the history of present illness.   Otherwise, review of systems are positive for none.   All other systems are reviewed and negative.    PHYSICAL EXAM: VS:  BP 120/80 (BP Location: Right Arm)   Pulse 63   Ht 6' (1.829 m)   Wt 180 lb (81.6 kg)   SpO2 98%   BMI 24.41 kg/m  , BMI Body mass index is 24.41 kg/m. GENERAL:  Well appearing HEENT:  Pupils equal round and reactive, fundi not visualized, oral mucosa unremarkable NECK:  No jugular venous distention, waveform within normal limits,  carotid upstroke brisk and symmetric, no bruits, no thyromegaly LYMPHATICS:  No cervical, inguinal adenopathy LUNGS:  Clear to auscultation bilaterally BACK:  No CVA tenderness CHEST:  Unremarkable HEART:  PMI not displaced or sustained,S1 and S2 within normal limits, no S3, no S4, no clicks, no rubs, no murmurs ABD:  Flat, positive bowel sounds normal in frequency in pitch, no bruits, no rebound, no guarding, no midline pulsatile mass, no hepatomegaly, no splenomegaly EXT:  2 plus pulses throughout, no edema, no cyanosis no clubbing SKIN:  No rashes no nodules NEURO:  Cranial nerves II through XII grossly intact, motor grossly intact throughout PSYCH:  Cognitively intact, oriented to person place and time    EKG:  EKG is not ordered today. The ekg ordered 07/25/17  demonstrates sinus rhythm, rate 87, axis within normal limits, intervals within normal limits, no acute ST-T wave changes.   Recent Labs: No results found for requested labs within last 8760 hours.    Lipid Panel No results found for: CHOL, TRIG, HDL, CHOLHDL, VLDL, LDLCALC, LDLDIRECT    Wt Readings from Last 3 Encounters:  07/30/17 180 lb (81.6 kg)  01/27/15 190 lb 2 oz (86.2 kg)  01/14/15 185 lb (83.9 kg)      Other studies Reviewed: Additional studies/ records that were reviewed today include: ED records. Review of the above records demonstrates:  Please see elsewhere in the note.     ASSESSMENT AND PLAN:   ATRIAL FIB:  Mr. MAJD TISSUE Ponce has a CHA2DS2 - VASc score of 1  .  We had a discussion about this.  Current American guidelines suggest an aspirin and a discussion about anticoagulant.  He would prefer not to be DOAC at this point.  I actually do not think there is a benefit to the aspirin and this is in agreement with European guidelines.  We talked about this and he prefer not to take anything unless he has any recurrent symptoms.  I will check an echocardiogram and 24-hour Holter to see if there is any evidence of asymptomatic paroxysms although he was very clear that he felt the onset and offset of the fibrillation.  As far as his risk score goes I would calculated as 1 though he had some slightly high blood pressures in the emergency room.  He does not have a diagnosis of hypertension.    DYSLIPIDEMIA:  He has an LDL of 82 and HDL of 50 and is treated with Lipitor per Crist Infante, Ponce  Current medicines are reviewed at length with the patient today.  The patient does not have concerns regarding medicines.  The following changes have been made:  no change  Labs/ tests ordered today include:   Orders Placed This Encounter  Procedures  . HOLTER MONITOR - 24 HOUR  . ECHOCARDIOGRAM COMPLETE     Disposition:   FU with me in 2 months.     Signed, Minus Breeding, Ponce  07/30/2017 10:24 AM    [facility]

## 2017-07-30 ENCOUNTER — Encounter: Payer: Self-pay | Admitting: Cardiology

## 2017-07-30 ENCOUNTER — Ambulatory Visit (INDEPENDENT_AMBULATORY_CARE_PROVIDER_SITE_OTHER): Payer: Medicare Other | Admitting: Cardiology

## 2017-07-30 VITALS — BP 120/80 | HR 63 | Ht 72.0 in | Wt 180.0 lb

## 2017-07-30 DIAGNOSIS — I4891 Unspecified atrial fibrillation: Secondary | ICD-10-CM

## 2017-07-30 NOTE — Patient Instructions (Signed)
Medication Instructions:  STOP- Metoprolol and Eliquis  If you need a refill on your cardiac medications before your next appointment, please call your pharmacy.  Labwork: None Ordered   Testing/Procedures: Your physician has requested that you have an echocardiogram. Echocardiography is a painless test that uses sound waves to create images of your heart. It provides your doctor with information about the size and shape of your heart and how well your heart's chambers and valves are working. This procedure takes approximately one hour. There are no restrictions for this procedure.  Your physician has recommended that you wear a 24 hour holter monitor. Holter monitors are medical devices that record the heart's electrical activity. Doctors most often use these monitors to diagnose arrhythmias. Arrhythmias are problems with the speed or rhythm of the heartbeat. The monitor is a small, portable device. You can wear one while you do your normal daily activities. This is usually used to diagnose what is causing palpitations/syncope (passing out).   Follow-Up: Your physician wants you to follow-up in: 2 Months.    Thank you for choosing [facility] at [facility]!!

## 2017-08-05 DIAGNOSIS — E7849 Other hyperlipidemia: Secondary | ICD-10-CM | POA: Diagnosis not present

## 2017-08-05 DIAGNOSIS — M109 Gout, unspecified: Secondary | ICD-10-CM | POA: Diagnosis not present

## 2017-08-05 DIAGNOSIS — M859 Disorder of bone density and structure, unspecified: Secondary | ICD-10-CM | POA: Diagnosis not present

## 2017-08-05 DIAGNOSIS — R7301 Impaired fasting glucose: Secondary | ICD-10-CM | POA: Diagnosis not present

## 2017-08-05 DIAGNOSIS — R82998 Other abnormal findings in urine: Secondary | ICD-10-CM | POA: Diagnosis not present

## 2017-08-05 DIAGNOSIS — Z125 Encounter for screening for malignant neoplasm of prostate: Secondary | ICD-10-CM | POA: Diagnosis not present

## 2017-08-11 ENCOUNTER — Other Ambulatory Visit (HOSPITAL_COMMUNITY): Payer: Medicare Other

## 2017-08-12 DIAGNOSIS — R609 Edema, unspecified: Secondary | ICD-10-CM | POA: Diagnosis not present

## 2017-08-12 DIAGNOSIS — Z0000 Encounter for general adult medical examination without abnormal findings: Secondary | ICD-10-CM | POA: Diagnosis not present

## 2017-08-12 DIAGNOSIS — E7849 Other hyperlipidemia: Secondary | ICD-10-CM | POA: Diagnosis not present

## 2017-08-12 DIAGNOSIS — M109 Gout, unspecified: Secondary | ICD-10-CM | POA: Diagnosis not present

## 2017-08-12 DIAGNOSIS — B1910 Unspecified viral hepatitis B without hepatic coma: Secondary | ICD-10-CM | POA: Diagnosis not present

## 2017-08-12 DIAGNOSIS — I480 Paroxysmal atrial fibrillation: Secondary | ICD-10-CM | POA: Diagnosis not present

## 2017-08-12 DIAGNOSIS — M859 Disorder of bone density and structure, unspecified: Secondary | ICD-10-CM | POA: Diagnosis not present

## 2017-08-12 DIAGNOSIS — Z1389 Encounter for screening for other disorder: Secondary | ICD-10-CM | POA: Diagnosis not present

## 2017-08-12 DIAGNOSIS — Z6824 Body mass index (BMI) 24.0-24.9, adult: Secondary | ICD-10-CM | POA: Diagnosis not present

## 2017-08-12 DIAGNOSIS — H6123 Impacted cerumen, bilateral: Secondary | ICD-10-CM | POA: Diagnosis not present

## 2017-08-12 DIAGNOSIS — N401 Enlarged prostate with lower urinary tract symptoms: Secondary | ICD-10-CM | POA: Diagnosis not present

## 2017-08-12 DIAGNOSIS — R7301 Impaired fasting glucose: Secondary | ICD-10-CM | POA: Diagnosis not present

## 2017-08-13 DIAGNOSIS — Z1212 Encounter for screening for malignant neoplasm of rectum: Secondary | ICD-10-CM | POA: Diagnosis not present

## 2017-08-19 ENCOUNTER — Ambulatory Visit (INDEPENDENT_AMBULATORY_CARE_PROVIDER_SITE_OTHER): Payer: Medicare Other

## 2017-08-19 ENCOUNTER — Other Ambulatory Visit: Payer: Self-pay

## 2017-08-19 ENCOUNTER — Ambulatory Visit (HOSPITAL_COMMUNITY): Payer: Medicare Other | Attending: Cardiovascular Disease

## 2017-08-19 DIAGNOSIS — I4891 Unspecified atrial fibrillation: Secondary | ICD-10-CM | POA: Diagnosis not present

## 2017-08-30 DIAGNOSIS — H269 Unspecified cataract: Secondary | ICD-10-CM | POA: Insufficient documentation

## 2017-08-30 DIAGNOSIS — T7840XA Allergy, unspecified, initial encounter: Secondary | ICD-10-CM | POA: Insufficient documentation

## 2017-08-30 DIAGNOSIS — B1910 Unspecified viral hepatitis B without hepatic coma: Secondary | ICD-10-CM | POA: Insufficient documentation

## 2017-09-08 DIAGNOSIS — H903 Sensorineural hearing loss, bilateral: Secondary | ICD-10-CM | POA: Diagnosis not present

## 2017-09-25 NOTE — Progress Notes (Signed)
Cardiology Office Note   Date:  09/27/2017   ID:  Thomas, Ponce [birth year], MRN [id number]  PCP:  Crist Infante, MD  Cardiologist:   Minus Breeding, MD  Referring:  Crist Infante, MD  No chief complaint on file.     History of Present Illness: Thomas Ponce is a 72 y.o. male who was referred by Crist Infante, MD for evaluation of atrial fib.  In 2018 he was hospitalized in [facility] with atrial fibrillation with a rapid rate.  He had rate control with beta-blockers and then converted spontaneously to sinus rhythm.  After the first visit with me I followed up with an echo and the EF was normal.  He had no atrial fib on a Holter.  He returns for follow up.  Since I last saw him he has had no further tachypalpitations and no suggestion of atrial fibrillation.  He walks routinely and has had no cardiovascular symptoms.  The patient denies any new symptoms such as chest discomfort, neck or arm discomfort. There has been no new shortness of breath, PND or orthopnea. There have been no reported palpitations, presyncope or syncope.   Past Medical History:  Diagnosis Date  . Allergy   . Cataract    bil cateracts removed  . Hepatitis B    1976    Past Surgical History:  Procedure Laterality Date  . bilateral cateracts removal    . COLONOSCOPY    . EYE SURGERY    . tonillectomy and adnoidectomy       Current Outpatient Medications  Medication Sig Dispense Refill  . atorvastatin (LIPITOR) 10 MG tablet Take 10 mg by mouth daily.    . fluticasone (FLONASE) 50 MCG/ACT nasal spray Place into both nostrils daily.    . tamsulosin (FLOMAX) 0.4 MG CAPS capsule Take 0.4 mg by mouth.    Marland Kitchen VIAGRA 100 MG tablet See admin instructions.  5   No current facility-administered medications for this visit.     Allergies:   Patient has no known allergies.    ROS:  Please see the history of present illness.   Otherwise, review of systems are positive for NONE.   All other systems  are reviewed and negative.    PHYSICAL EXAM: VS:  BP (!) 116/58   Pulse 62   Ht 6' (1.829 m)   Wt 179 lb 6.4 oz (81.4 kg)   SpO2 98%   BMI 24.33 kg/m  , BMI Body mass index is 24.33 kg/m.  GENERAL:  Well appearing NECK:  No jugular venous distention, waveform within normal limits, carotid upstroke brisk and symmetric, no bruits, no thyromegaly LUNGS:  Clear to auscultation bilaterally CHEST:  Unremarkable HEART:  PMI not displaced or sustained,S1 and S2 within normal limits, no S3, no S4, no clicks, no rubs, NO murmurs ABD:  Flat, positive bowel sounds normal in frequency in pitch, no bruits, no rebound, no guarding, no midline pulsatile mass, no hepatomegaly, no splenomegaly EXT:  2 plus pulses throughout, no edema, no cyanosis no clubbin   EKG:  EKG is not  ordered today.   Recent Labs: No results found for requested labs within last 8760 hours.    Lipid Panel No results found for: CHOL, TRIG, HDL, CHOLHDL, VLDL, LDLCALC, LDLDIRECT    Wt Readings from Last 3 Encounters:  09/27/17 179 lb 6.4 oz (81.4 kg)  07/30/17 180 lb (81.6 kg)  01/27/15 190 lb 2 oz (86.2 kg)  Other studies Reviewed: Additional studies/ records that were reviewed today include:  Echo and Holter and CT from 2010 Review of the above records demonstrates:    See below     ASSESSMENT AND PLAN:   ATRIAL FIB:  Thomas Ponce has a CHA2DS2 - VASc score of 1  .  He has had no recurrence of this.  No change in therapy is indicated.  DYSLIPIDEMIA:    He remains on low dose statin.   He has an LDL of 82 and HDL of 50 and is treated with Lipitor per Crist Infante, MD   ELEVATED CORONARY CALCIUM: One coronary calcium scoring in 2010 he was noted to have calcium in the put him in the 25-50% range.  He has a family history. I will bring the patient back for a POET (Plain Old Exercise Test). This will allow me to screen for obstructive coronary disease, risk stratify and very importantly provide a  prescription for exercise.   Current medicines are reviewed at length with the patient today.  The patient does not have concerns regarding medicines.  The following changes have been made: None  Labs/ tests ordered today include:     Orders Placed This Encounter  Procedures  . Exercise Tolerance Test     Disposition:   FU with me as needed.  Signed, Minus Breeding, MD  09/27/2017 9:53 AM    [facility]

## 2017-09-27 ENCOUNTER — Ambulatory Visit (INDEPENDENT_AMBULATORY_CARE_PROVIDER_SITE_OTHER): Payer: Medicare Other | Admitting: Cardiology

## 2017-09-27 ENCOUNTER — Encounter: Payer: Self-pay | Admitting: Cardiology

## 2017-09-27 VITALS — BP 116/58 | HR 62 | Ht 72.0 in | Wt 179.4 lb

## 2017-09-27 DIAGNOSIS — I480 Paroxysmal atrial fibrillation: Secondary | ICD-10-CM | POA: Diagnosis not present

## 2017-09-27 DIAGNOSIS — R931 Abnormal findings on diagnostic imaging of heart and coronary circulation: Secondary | ICD-10-CM

## 2017-09-27 DIAGNOSIS — I2510 Atherosclerotic heart disease of native coronary artery without angina pectoris: Secondary | ICD-10-CM | POA: Diagnosis not present

## 2017-09-27 NOTE — Patient Instructions (Signed)
Medication Instructions:  Continue current medications  If you need a refill on your cardiac medications before your next appointment, please call your pharmacy.  Labwork: None Ordered   Testing/Procedures: Your physician has requested that you have an exercise tolerance test. For further information please visit [email or web address]. Please also follow instruction sheet, as given.   Follow-Up: Your physician wants you to follow-up in: As Needed.     Thank you for choosing [facility]!!       

## 2017-10-15 ENCOUNTER — Telehealth (HOSPITAL_COMMUNITY): Payer: Self-pay

## 2017-10-15 NOTE — Telephone Encounter (Signed)
Encounter complete. 

## 2017-10-20 ENCOUNTER — Ambulatory Visit (HOSPITAL_COMMUNITY)
Admission: RE | Admit: 2017-10-20 | Discharge: 2017-10-20 | Disposition: A | Discharge status: Home / Self Care | Payer: Medicare Other | Source: Ambulatory Visit | Attending: Cardiovascular Disease | Admitting: Cardiovascular Disease

## 2017-10-20 DIAGNOSIS — R931 Abnormal findings on diagnostic imaging of heart and coronary circulation: Secondary | ICD-10-CM | POA: Insufficient documentation

## 2017-10-20 LAB — EXERCISE TOLERANCE TEST
CHL CUP MPHR: 147 {beats}/min
CHL CUP RESTING HR STRESS: 66 {beats}/min
Estimated workload: 13.4 METS
Exercise duration (min): 12 min
Exercise duration (sec): 0 s
Peak HR: 179 {beats}/min
Percent HR: 121 %
RPE: 18

## 2017-11-22 DIAGNOSIS — R945 Abnormal results of liver function studies: Secondary | ICD-10-CM | POA: Diagnosis not present

## 2017-11-26 DIAGNOSIS — R972 Elevated prostate specific antigen [PSA]: Secondary | ICD-10-CM | POA: Diagnosis not present

## 2017-12-03 DIAGNOSIS — R3912 Poor urinary stream: Secondary | ICD-10-CM | POA: Diagnosis not present

## 2017-12-03 DIAGNOSIS — N5201 Erectile dysfunction due to arterial insufficiency: Secondary | ICD-10-CM | POA: Diagnosis not present

## 2017-12-03 DIAGNOSIS — R972 Elevated prostate specific antigen [PSA]: Secondary | ICD-10-CM | POA: Diagnosis not present

## 2017-12-03 DIAGNOSIS — N401 Enlarged prostate with lower urinary tract symptoms: Secondary | ICD-10-CM | POA: Diagnosis not present

## 2018-02-23 DIAGNOSIS — R972 Elevated prostate specific antigen [PSA]: Secondary | ICD-10-CM | POA: Diagnosis not present

## 2018-05-14 DIAGNOSIS — Z23 Encounter for immunization: Secondary | ICD-10-CM | POA: Diagnosis not present

## 2018-06-30 DIAGNOSIS — H04123 Dry eye syndrome of bilateral lacrimal glands: Secondary | ICD-10-CM | POA: Diagnosis not present

## 2018-06-30 DIAGNOSIS — Z961 Presence of intraocular lens: Secondary | ICD-10-CM | POA: Diagnosis not present

## 2018-09-15 DIAGNOSIS — M8589 Other specified disorders of bone density and structure, multiple sites: Secondary | ICD-10-CM | POA: Diagnosis not present

## 2018-09-15 DIAGNOSIS — Z125 Encounter for screening for malignant neoplasm of prostate: Secondary | ICD-10-CM | POA: Diagnosis not present

## 2018-09-15 DIAGNOSIS — M109 Gout, unspecified: Secondary | ICD-10-CM | POA: Diagnosis not present

## 2018-09-15 DIAGNOSIS — M859 Disorder of bone density and structure, unspecified: Secondary | ICD-10-CM | POA: Diagnosis not present

## 2018-09-15 DIAGNOSIS — R82998 Other abnormal findings in urine: Secondary | ICD-10-CM | POA: Diagnosis not present

## 2018-09-15 DIAGNOSIS — R7301 Impaired fasting glucose: Secondary | ICD-10-CM | POA: Diagnosis not present

## 2018-09-15 DIAGNOSIS — E7849 Other hyperlipidemia: Secondary | ICD-10-CM | POA: Diagnosis not present

## 2018-09-23 DIAGNOSIS — E7849 Other hyperlipidemia: Secondary | ICD-10-CM | POA: Diagnosis not present

## 2018-09-23 DIAGNOSIS — Z0000 Encounter for general adult medical examination without abnormal findings: Secondary | ICD-10-CM | POA: Diagnosis not present

## 2018-09-23 DIAGNOSIS — R7301 Impaired fasting glucose: Secondary | ICD-10-CM | POA: Diagnosis not present

## 2018-09-23 DIAGNOSIS — H6123 Impacted cerumen, bilateral: Secondary | ICD-10-CM | POA: Diagnosis not present

## 2018-09-23 DIAGNOSIS — D126 Benign neoplasm of colon, unspecified: Secondary | ICD-10-CM | POA: Diagnosis not present

## 2018-09-23 DIAGNOSIS — Z6822 Body mass index (BMI) 22.0-22.9, adult: Secondary | ICD-10-CM | POA: Diagnosis not present

## 2018-09-23 DIAGNOSIS — Z1212 Encounter for screening for malignant neoplasm of rectum: Secondary | ICD-10-CM | POA: Diagnosis not present

## 2018-09-23 DIAGNOSIS — Z1339 Encounter for screening examination for other mental health and behavioral disorders: Secondary | ICD-10-CM | POA: Diagnosis not present

## 2018-09-23 DIAGNOSIS — Z1331 Encounter for screening for depression: Secondary | ICD-10-CM | POA: Diagnosis not present

## 2018-09-23 DIAGNOSIS — B1910 Unspecified viral hepatitis B without hepatic coma: Secondary | ICD-10-CM | POA: Diagnosis not present

## 2018-09-23 DIAGNOSIS — N401 Enlarged prostate with lower urinary tract symptoms: Secondary | ICD-10-CM | POA: Diagnosis not present

## 2018-09-23 DIAGNOSIS — M79676 Pain in unspecified toe(s): Secondary | ICD-10-CM | POA: Diagnosis not present

## 2018-09-23 DIAGNOSIS — I480 Paroxysmal atrial fibrillation: Secondary | ICD-10-CM | POA: Diagnosis not present

## 2018-10-12 DIAGNOSIS — R945 Abnormal results of liver function studies: Secondary | ICD-10-CM | POA: Diagnosis not present

## 2018-10-12 DIAGNOSIS — B1910 Unspecified viral hepatitis B without hepatic coma: Secondary | ICD-10-CM | POA: Diagnosis not present

## 2018-12-14 DIAGNOSIS — R972 Elevated prostate specific antigen [PSA]: Secondary | ICD-10-CM | POA: Diagnosis not present

## 2018-12-21 DIAGNOSIS — N5201 Erectile dysfunction due to arterial insufficiency: Secondary | ICD-10-CM | POA: Diagnosis not present

## 2018-12-21 DIAGNOSIS — R972 Elevated prostate specific antigen [PSA]: Secondary | ICD-10-CM | POA: Diagnosis not present

## 2018-12-21 DIAGNOSIS — R3912 Poor urinary stream: Secondary | ICD-10-CM | POA: Diagnosis not present

## 2018-12-21 DIAGNOSIS — N401 Enlarged prostate with lower urinary tract symptoms: Secondary | ICD-10-CM | POA: Diagnosis not present

## 2019-05-05 DIAGNOSIS — Z23 Encounter for immunization: Secondary | ICD-10-CM | POA: Diagnosis not present

## 2019-07-05 DIAGNOSIS — Z961 Presence of intraocular lens: Secondary | ICD-10-CM | POA: Diagnosis not present

## 2019-07-05 DIAGNOSIS — H04123 Dry eye syndrome of bilateral lacrimal glands: Secondary | ICD-10-CM | POA: Diagnosis not present

## 2019-08-08 ENCOUNTER — Encounter: Payer: Self-pay | Admitting: *Deleted

## 2019-08-08 ENCOUNTER — Telehealth: Payer: Self-pay | Admitting: Cardiology

## 2019-08-08 NOTE — Telephone Encounter (Signed)
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  He has had some brief runs of atrial fib.  10 minutes.  At this point I don't think that he needs antiarrhythmic or change in AV nodal meds.  However, now that he is 53 he needs DOAC.  I will start Xarelto 20 mg po daily.  Please call in prescription for 90 pills with 3 refills to [facility].

## 2019-08-09 DIAGNOSIS — E785 Hyperlipidemia, unspecified: Secondary | ICD-10-CM | POA: Insufficient documentation

## 2019-08-09 DIAGNOSIS — R931 Abnormal findings on diagnostic imaging of heart and coronary circulation: Secondary | ICD-10-CM | POA: Insufficient documentation

## 2019-08-09 DIAGNOSIS — Z7189 Other specified counseling: Secondary | ICD-10-CM | POA: Insufficient documentation

## 2019-08-09 DIAGNOSIS — I480 Paroxysmal atrial fibrillation: Secondary | ICD-10-CM | POA: Insufficient documentation

## 2019-08-09 NOTE — Progress Notes (Deleted)
Cardiology Office Note   Date:  08/09/2019   ID:  Thomas Ponce, DOB 1945-12-21, MRN [id number]  PCP:  Crist Infante, MD  Cardiologist:   Minus Breeding, MD  Referring:  Crist Infante, MD  No chief complaint on file.     History of Present Illness: Thomas Ponce is a 73 y.o. male who was referred by Crist Infante, MD for evaluation of atrial fib.  In 2018 he was hospitalized in [facility] with atrial fibrillation with a rapid rate.  He had rate control with beta-blockers and then converted spontaneously to sinus rhythm.  He called the other day and had another paroxyms of atrial fib.  I did start Thomas Ponce.   ***   After the first visit with me I followed up with an echo and the EF was normal.  He had no atrial fib on a Holter.  He returns for follow up.  Since I last saw him he has had no further tachypalpitations and no suggestion of atrial fibrillation.  He walks routinely and has had no cardiovascular symptoms.  The patient denies any new symptoms such as chest discomfort, neck or arm discomfort. There has been no new shortness of breath, PND or orthopnea. There have been no reported palpitations, presyncope or syncope.   Past Medical History:  Diagnosis Date  . Allergy   . Cataract    bil cateracts removed  . Hepatitis B    1976    Past Surgical History:  Procedure Laterality Date  . bilateral cateracts removal    . COLONOSCOPY    . EYE SURGERY    . tonillectomy and adnoidectomy       Current Outpatient Medications  Medication Sig Dispense Refill  . atorvastatin (LIPITOR) 10 MG tablet Take 10 mg by mouth daily.    . fluticasone (FLONASE) 50 MCG/ACT nasal spray Place into both nostrils daily.    . tamsulosin (FLOMAX) 0.4 MG CAPS capsule Take 0.4 mg by mouth.    Marland Kitchen VIAGRA 100 MG tablet See admin instructions.  5   No current facility-administered medications for this visit.     Allergies:   Patient has no known allergies.    ROS:  Please see the history  of present illness.   Otherwise, review of systems are positive for ***.   All other systems are reviewed and negative.    PHYSICAL EXAM: VS:  There were no vitals taken for this visit. , BMI There is no height or weight on file to calculate BMI.  GENERAL:  Well appearing NECK:  No jugular venous distention, waveform within normal limits, carotid upstroke brisk and symmetric, no bruits, no thyromegaly LUNGS:  Clear to auscultation bilaterally CHEST:  Unremarkable HEART:  PMI not displaced or sustained,S1 and S2 within normal limits, no S3, no S4, no clicks, no rubs, *** murmurs ABD:  Flat, positive bowel sounds normal in frequency in pitch, no bruits, no rebound, no guarding, no midline pulsatile mass, no hepatomegaly, no splenomegaly EXT:  2 plus pulses throughout, no edema, no cyanosis no clubbing    ***GENERAL:  Well appearing NECK:  No jugular venous distention, waveform within normal limits, carotid upstroke brisk and symmetric, no bruits, no thyromegaly LUNGS:  Clear to auscultation bilaterally CHEST:  Unremarkable HEART:  PMI not displaced or sustained,S1 and S2 within normal limits, no S3, no S4, no clicks, no rubs, NO murmurs ABD:  Flat, positive bowel sounds normal in frequency in pitch, no bruits, no rebound, no  guarding, no midline pulsatile mass, no hepatomegaly, no splenomegaly EXT:  2 plus pulses throughout, no edema, no cyanosis no clubbin   EKG:  EKG is not *** ordered today. ***  Recent Labs: No results found for requested labs within last 8760 hours.    Lipid Panel No results found for: CHOL, TRIG, HDL, CHOLHDL, VLDL, LDLCALC, LDLDIRECT    Wt Readings from Last 3 Encounters:  09/27/17 179 lb 6.4 oz (81.4 kg)  07/30/17 180 lb (81.6 kg)  01/27/15 190 lb 2 oz (86.2 kg)      Other studies Reviewed: Additional studies/ records that were reviewed today include:  *** Review of the above records demonstrates:    ***   ASSESSMENT AND PLAN:   ATRIAL FIB:   Thomas Ponce has a CHA2DS2 - VASc score of 2  *** .  He has had no recurrence of this.  No change in therapy is indicated.  DYSLIPIDEMIA:   *** He remains on low dose statin.   He has an LDL of 82 and HDL of 50 and is treated with Lipitor per Crist Infante, MD   ELEVATED CORONARY CALCIUM:  He had a negative POET (Plain Old Exercise Treadmill) last year. ***  One coronary calcium scoring in 2010 he was noted to have calcium in the put him in the 25-50% range.  He has a family history. I will bring the patient back for a POET (Plain Old Exercise Test). This will allow me to screen for obstructive coronary disease, risk stratify and very importantly provide a prescription for exercise.   COVID EDUCATION:  ***   Current medicines are reviewed at length with the patient today.  The patient does not have concerns regarding medicines.  The following changes have been made: None  Labs/ tests ordered today include:     No orders of the defined types were placed in this encounter.    Disposition:   FU with me as needed.  Signed, Minus Breeding, MD  08/09/2019 8:58 PM    [place]

## 2019-08-10 ENCOUNTER — Telehealth: Payer: Self-pay | Admitting: *Deleted

## 2019-08-10 ENCOUNTER — Ambulatory Visit: Payer: Medicare Other | Admitting: Cardiology

## 2019-08-10 MED ORDER — RIVAROXABAN 20 MG PO TABS: 20.0000 mg | ORAL_TABLET | Freq: Every day | ORAL | 1 refills | Status: DC

## 2019-08-10 MED ORDER — RIVAROXABAN 20 MG PO TABS: 20.0000 mg | ORAL_TABLET | Freq: Every day | ORAL | 3 refills | Status: DC

## 2019-08-10 NOTE — Telephone Encounter (Signed)
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  He has had some brief runs of atrial fib.  10 minutes.  At this point I don't think that he needs antiarrhythmic or change in AV nodal meds.  However, now that he is 75 he needs DOAC.  I will start Xarelto 20 mg po daily.  Please call in prescription for 90 pills with 3 refills to [facility].      RX sent to [facility], patient aware   Patient stated he did not feel he needed office visit at this time, ok per Dr Percival Spanish

## 2019-09-13 ENCOUNTER — Ambulatory Visit: Payer: Medicare Other | Attending: Internal Medicine

## 2019-09-13 DIAGNOSIS — Z23 Encounter for immunization: Secondary | ICD-10-CM

## 2019-09-13 NOTE — Progress Notes (Signed)
   Covid-19 Vaccination Clinic  Name:  Thomas Ponce    MRN: [id number] DOB: Mar 11, 1946  09/13/2019  Thomas Ponce was observed post Covid-19 immunization for 15 minutes without incidence. He was provided with Vaccine Information Sheet and instruction to access the V-Safe system.   Thomas Ponce was instructed to call 911 with any severe reactions post vaccine: Marland Kitchen Difficulty breathing  . Swelling of your face and throat  . A fast heartbeat  . A bad rash all over your body  . Dizziness and weakness    Immunizations Administered    Name Date Dose VIS Date Route   Pfizer COVID-19 Vaccine 09/13/2019 12:28 PM 0.3 mL 08/11/2019 Intramuscular   Manufacturer: [place]   Lot: [id number]   [place]: [id number]

## 2019-10-04 ENCOUNTER — Ambulatory Visit: Payer: Medicare Other | Attending: Internal Medicine

## 2019-10-04 DIAGNOSIS — Z23 Encounter for immunization: Secondary | ICD-10-CM | POA: Insufficient documentation

## 2019-10-04 NOTE — Progress Notes (Signed)
   Covid-19 Vaccination Clinic  Name:  Thomas Ponce    MRN: [id number] DOB: 1946/06/15  10/04/2019  [name] was observed post Covid-19 immunization for 15 minutes without incidence. He was provided with Vaccine Information Sheet and instruction to access the V-Safe system.   [name] was instructed to call 911 with any severe reactions post vaccine: Marland Kitchen Difficulty breathing  . Swelling of your face and throat  . A fast heartbeat  . A bad rash all over your body  . Dizziness and weakness    Immunizations Administered    Name Date Dose VIS Date Route   Pfizer COVID-19 Vaccine 10/04/2019 10:10 AM 0.3 mL 08/11/2019 Intramuscular   Manufacturer: [place]   Lot: [id number]   [place]: [id number]     '

## 2019-10-23 DIAGNOSIS — E7849 Other hyperlipidemia: Secondary | ICD-10-CM | POA: Diagnosis not present

## 2019-10-23 DIAGNOSIS — M109 Gout, unspecified: Secondary | ICD-10-CM | POA: Diagnosis not present

## 2019-10-23 DIAGNOSIS — R7301 Impaired fasting glucose: Secondary | ICD-10-CM | POA: Diagnosis not present

## 2019-10-23 DIAGNOSIS — Z125 Encounter for screening for malignant neoplasm of prostate: Secondary | ICD-10-CM | POA: Diagnosis not present

## 2019-10-24 ENCOUNTER — Encounter: Payer: Self-pay | Admitting: Gastroenterology

## 2019-10-24 DIAGNOSIS — R82998 Other abnormal findings in urine: Secondary | ICD-10-CM | POA: Diagnosis not present

## 2019-10-25 DIAGNOSIS — Z1212 Encounter for screening for malignant neoplasm of rectum: Secondary | ICD-10-CM | POA: Diagnosis not present

## 2019-10-30 DIAGNOSIS — D126 Benign neoplasm of colon, unspecified: Secondary | ICD-10-CM | POA: Diagnosis not present

## 2019-10-30 DIAGNOSIS — I480 Paroxysmal atrial fibrillation: Secondary | ICD-10-CM | POA: Diagnosis not present

## 2019-10-30 DIAGNOSIS — Z0000 Encounter for general adult medical examination without abnormal findings: Secondary | ICD-10-CM | POA: Diagnosis not present

## 2019-10-30 DIAGNOSIS — D6869 Other thrombophilia: Secondary | ICD-10-CM | POA: Diagnosis not present

## 2019-10-30 DIAGNOSIS — H6123 Impacted cerumen, bilateral: Secondary | ICD-10-CM | POA: Diagnosis not present

## 2019-10-30 DIAGNOSIS — B1910 Unspecified viral hepatitis B without hepatic coma: Secondary | ICD-10-CM | POA: Diagnosis not present

## 2019-10-30 DIAGNOSIS — Z1331 Encounter for screening for depression: Secondary | ICD-10-CM | POA: Diagnosis not present

## 2019-10-30 DIAGNOSIS — E785 Hyperlipidemia, unspecified: Secondary | ICD-10-CM | POA: Diagnosis not present

## 2019-10-30 DIAGNOSIS — M8589 Other specified disorders of bone density and structure, multiple sites: Secondary | ICD-10-CM | POA: Diagnosis not present

## 2019-10-30 DIAGNOSIS — R7301 Impaired fasting glucose: Secondary | ICD-10-CM | POA: Diagnosis not present

## 2019-10-30 DIAGNOSIS — H9190 Unspecified hearing loss, unspecified ear: Secondary | ICD-10-CM | POA: Diagnosis not present

## 2019-10-30 DIAGNOSIS — R945 Abnormal results of liver function studies: Secondary | ICD-10-CM | POA: Diagnosis not present

## 2019-10-30 DIAGNOSIS — R972 Elevated prostate specific antigen [PSA]: Secondary | ICD-10-CM | POA: Diagnosis not present

## 2019-10-30 DIAGNOSIS — Z1339 Encounter for screening examination for other mental health and behavioral disorders: Secondary | ICD-10-CM | POA: Diagnosis not present

## 2019-11-03 ENCOUNTER — Telehealth: Payer: Self-pay | Admitting: Gastroenterology

## 2019-11-03 ENCOUNTER — Other Ambulatory Visit: Payer: Self-pay | Admitting: Internal Medicine

## 2019-11-03 DIAGNOSIS — E785 Hyperlipidemia, unspecified: Secondary | ICD-10-CM

## 2019-11-03 NOTE — Telephone Encounter (Signed)
I advised the pt that guidelines have changed, I offered an appt for the pt to come in to discuss if he chooses.  He states that he is ok with the change as well as Dr Ardis Hughs is ok with it.

## 2019-11-16 ENCOUNTER — Ambulatory Visit
Admission: RE | Admit: 2019-11-16 | Discharge: 2019-11-16 | Disposition: A | Discharge status: Home / Self Care | Payer: Medicare Other | Source: Ambulatory Visit | Attending: Internal Medicine | Admitting: Internal Medicine

## 2019-11-16 DIAGNOSIS — I700 Atherosclerosis of aorta: Secondary | ICD-10-CM | POA: Diagnosis not present

## 2019-11-16 DIAGNOSIS — I2510 Atherosclerotic heart disease of native coronary artery without angina pectoris: Secondary | ICD-10-CM | POA: Diagnosis not present

## 2019-11-16 DIAGNOSIS — E785 Hyperlipidemia, unspecified: Secondary | ICD-10-CM

## 2020-03-11 DIAGNOSIS — R972 Elevated prostate specific antigen [PSA]: Secondary | ICD-10-CM | POA: Diagnosis not present

## 2020-03-13 DIAGNOSIS — N401 Enlarged prostate with lower urinary tract symptoms: Secondary | ICD-10-CM | POA: Diagnosis not present

## 2020-03-13 DIAGNOSIS — R972 Elevated prostate specific antigen [PSA]: Secondary | ICD-10-CM | POA: Diagnosis not present

## 2020-03-13 DIAGNOSIS — R3912 Poor urinary stream: Secondary | ICD-10-CM | POA: Diagnosis not present

## 2020-03-13 DIAGNOSIS — N5201 Erectile dysfunction due to arterial insufficiency: Secondary | ICD-10-CM | POA: Diagnosis not present

## 2020-05-03 DIAGNOSIS — T63443A Toxic effect of venom of bees, assault, initial encounter: Secondary | ICD-10-CM | POA: Diagnosis not present

## 2020-05-03 DIAGNOSIS — L089 Local infection of the skin and subcutaneous tissue, unspecified: Secondary | ICD-10-CM | POA: Diagnosis not present

## 2020-05-11 ENCOUNTER — Other Ambulatory Visit: Payer: Self-pay

## 2020-05-11 ENCOUNTER — Emergency Department (HOSPITAL_COMMUNITY): Payer: Medicare Other

## 2020-05-11 ENCOUNTER — Emergency Department (HOSPITAL_COMMUNITY)
Admission: EM | Admit: 2020-05-11 | Discharge: 2020-05-11 | Disposition: A | Discharge status: Home / Self Care | Payer: Medicare Other | Attending: Emergency Medicine | Admitting: Emergency Medicine

## 2020-05-11 ENCOUNTER — Encounter (HOSPITAL_COMMUNITY): Payer: Self-pay | Admitting: *Deleted

## 2020-05-11 DIAGNOSIS — Z79899 Other long term (current) drug therapy: Secondary | ICD-10-CM | POA: Insufficient documentation

## 2020-05-11 DIAGNOSIS — R1013 Epigastric pain: Secondary | ICD-10-CM | POA: Diagnosis not present

## 2020-05-11 DIAGNOSIS — R079 Chest pain, unspecified: Secondary | ICD-10-CM | POA: Diagnosis not present

## 2020-05-11 LAB — CBC
HCT: 44.6 % (ref 39.0–52.0)
Hemoglobin: 14.2 g/dL (ref 13.0–17.0)
MCH: 29.6 pg (ref 26.0–34.0)
MCHC: 31.8 g/dL (ref 30.0–36.0)
MCV: 93.1 fL (ref 80.0–100.0)
Platelets: 171 10*3/uL (ref 150–400)
RBC: 4.79 MIL/uL (ref 4.22–5.81)
RDW: 12.9 % (ref 11.5–15.5)
WBC: 5.1 10*3/uL (ref 4.0–10.5)
nRBC: 0 % (ref 0.0–0.2)

## 2020-05-11 LAB — BASIC METABOLIC PANEL
Anion gap: 8 (ref 5–15)
BUN: 18 mg/dL (ref 8–23)
CO2: 29 mmol/L (ref 22–32)
Calcium: 9.5 mg/dL (ref 8.9–10.3)
Chloride: 104 mmol/L (ref 98–111)
Creatinine, Ser: 0.9 mg/dL (ref 0.61–1.24)
GFR calc Af Amer: >60 mL/min (ref >60)
GFR calc non Af Amer: >60 mL/min (ref >60)
Glucose, Bld: 104 mg/dL — ABNORMAL HIGH (ref 70–99)
Potassium: 4.2 mmol/L (ref 3.5–5.1)
Sodium: 141 mmol/L (ref 135–145)

## 2020-05-11 LAB — TROPONIN I (HIGH SENSITIVITY)
Troponin I (High Sensitivity): 10 ng/L (ref <18)
Troponin I (High Sensitivity): 7 ng/L (ref <18)

## 2020-05-11 MED ORDER — PANTOPRAZOLE SODIUM 40 MG PO TBEC: 40.0000 mg | DELAYED_RELEASE_TABLET | Freq: Every day | ORAL | 0 refills | Status: DC

## 2020-05-11 NOTE — ED Notes (Signed)
Discharge instructions discussed with pt. Pt verbalized understanding. Pt stable and ambulatory. No signature pad available. 

## 2020-05-11 NOTE — Discharge Instructions (Addendum)
Use antacids as needed.  Return if you are having any problems.

## 2020-05-11 NOTE — ED Notes (Signed)
Pt ambulated independently to bed

## 2020-05-11 NOTE — ED Provider Notes (Signed)
[facility] EMER[facility]ENCY DEPARTMENT Provider Note   CSN: [id number] Arrival date & time: 05/11/20  0107   History Chief Complaint  Patient presents with  . Chest Pain    Thomas Ponce is a 74 y.o. male.  The history is provided by the patient.  Chest Pain He has history of hyperlipidemia, paroxysmal atrial fibrillation and comes in because of chest pain.  Pain is in the lower sternal/epigastric area without radiation.  It started about 1 hour after eating and improved after taking ranitidine and and and acid.  He went to sleep and was awakened by recurrence of pain.  Pain was only moderately severe and he rated it at 5/10.  It is now subsided to 1/10.  He had not taken any additional medication except for aspirin.  He has not had pain like this before.  He states that he is very active and has not noticed any change in his exercise tolerance.  Past Medical History:  Diagnosis Date  . Allergy   . Cataract    bil cateracts removed  . Hepatitis B    1978    Patient Active Problem List   Diagnosis Date Noted  . PAF (paroxysmal atrial fibrillation) ([place]) 08/09/2019  . Dyslipidemia 08/09/2019  . Elevated coronary artery calcium score 08/09/2019  . Educated about COVID-19 virus infection 08/09/2019  . Hepatitis B   . Allergy   . Cataract     Past Surgical History:  Procedure Laterality Date  . bilateral cateracts removal    . COLONOSCOPY    . EYE SURGERY    . tonillectomy and adnoidectomy         Family History  Problem Relation Age of Onset  . Heart disease Mother   . Hypertension Mother   . Heart disease Father   . Hypertension Father   . Ovarian cancer Maternal Aunt   . Uterine cancer Maternal Aunt   . Stomach cancer Cousin 4       materal 1st cousin  . Colon cancer Neg Hx   . Esophageal cancer Neg Hx   . Rectal cancer Neg Hx     Social History   Tobacco Use  . Smoking status: Never Smoker  . Smokeless tobacco: Never Used    Substance Use Topics  . Alcohol use: Yes    Alcohol/week: 10.0 standard drinks    Types: 10 Glasses of wine per week  . Drug use: No    Home Medications Prior to Admission medications   Medication Sig Start Date End Date Taking? Authorizing Provider  atorvastatin (LIPITOR) 10 MG tablet Take 10 mg by mouth daily.    [provider]  fluticasone (FLONASE) 50 MCG/ACT nasal spray Place into both nostrils daily.    [provider]  rivaroxaban (XARELTO) 20 MG TABS tablet Take 1 tablet (20 mg total) by mouth daily with supper. 08/10/19   Minus Breeding, MD  tamsulosin (FLOMAX) 0.4 MG CAPS capsule Take 0.4 mg by mouth.    [provider]  VIAGRA 100 MG tablet See admin instructions. 01/14/15   [provider]    Allergies    Patient has no known allergies.  Review of Systems   Review of Systems  Cardiovascular: Positive for chest pain.  All other systems reviewed and are negative.   Physical Exam Updated Vital Signs BP (!) 145/83 (BP Location: Left Arm)   Pulse 60   Temp 98.3 F (36.8 C) (Oral)   Resp 18  Ht 6' (1.829 m)   Wt 74.8 kg   SpO2 100%   BMI 22.38 kg/m   Physical Exam Vitals and nursing note reviewed.   74 year old male, resting comfortably and in no acute distress. Vital signs are significant for borderline elevated blood pressure. Oxygen saturation is 100%, which is normal. Head is normocephalic and atraumatic. PERRLA, EOMI. Oropharynx is clear. Neck is nontender and supple without adenopathy or JVD. Back is nontender and there is no CVA tenderness. Lungs are clear without rales, wheezes, or rhonchi. Chest is nontender. Heart has regular rate and rhythm without murmur. Abdomen is soft, flat, nontender without masses or hepatosplenomegaly and peristalsis is normoactive. Extremities have no cyanosis or edema, full range of motion is present. Skin is warm and dry without rash. Neurologic: Mental status is normal, cranial  nerves are intact, there are no motor or sensory deficits.  ED Results / Procedures / Treatments   Labs (all labs ordered are listed, but only abnormal results are displayed) Labs Reviewed  BASIC METABOLIC PANEL - Abnormal; Notable for the following components:      Result Value   Glucose, Bld 104 (*)    All other components within normal limits  CBC  TROPONIN I (HIGH SENSITIVITY)  TROPONIN I (HIGH SENSITIVITY)    EKG EKG Interpretation  Date/Time:  Saturday May 11 2020 01:14:36 EDT Ventricular Rate:  63 PR Interval:  206 QRS Duration: 90 QT Interval:  392 QTC Calculation: 401 R Axis:   78 Text Interpretation: Normal sinus rhythm Normal ECG No old tracing to compare Confirmed by Delora Fuel ([id number]) on 05/11/2020 2:42:38 AM   Radiology DG Chest 2 View  Result Date: 05/11/2020 CLINICAL DATA:  Chest pain. EXAM: CHEST - 2 VIEW COMPARISON:  None. FINDINGS: The lungs are hyperinflated. Mild to moderate severity chronic appearing increased interstitial lung markings are noted. The heart size and mediastinal contours are within normal limits. The visualized skeletal structures are unremarkable. IMPRESSION: 1. Findings consistent with COPD. 2. No acute infiltrate or pleural effusion. Electronically Signed   By: Virgina Norfolk M.D.   On: 05/11/2020 01:53    Procedures Procedures   Medications Ordered in ED Medications - No data to display  ED Course  I have reviewed the triage vital signs and the nursing notes.  Pertinent labs & imaging results that were available during my care of the patient were reviewed by me and considered in my medical decision making (see chart for details).  MDM Rules/Calculators/A&P Epigastric/chest pain of uncertain cause.  ECG is normal and troponin is normal x2.  Chest x-ray shows no acute process.  At this point, I think there is a very low risk of cardiac disease and he is felt to be safe for discharge.  Old records are reviewed confirming  history of paroxysmal atrial fibrillation.  He had been prescribed rivaroxaban but has chosen not to take it.  Heart score is 3, which puts him at low risk for major adverse cardiac events in the next 6 weeks.  He is discharged with prescription for pantoprazole and was referred back to his primary care provider and as well as his cardiologist.  Return precautions discussed.  Final Clinical Impression(s) / ED Diagnoses Final diagnoses:  Epigastric pain    Rx / DC Orders ED Discharge Orders         Ordered    pantoprazole (PROTONIX) 40 MG tablet  Daily        05/11/20 0506  Delora Fuel, MD [birth year] [phone]

## 2020-05-11 NOTE — ED Triage Notes (Signed)
The pt has had chest pain after dinner earlier the pain went away then returned just prior to arrival  He took 6 baby aspirin before he came over

## 2020-05-27 DIAGNOSIS — Z23 Encounter for immunization: Secondary | ICD-10-CM | POA: Diagnosis not present

## 2020-06-15 DIAGNOSIS — Z23 Encounter for immunization: Secondary | ICD-10-CM | POA: Diagnosis not present

## 2020-08-05 DIAGNOSIS — Z20822 Contact with and (suspected) exposure to covid-19: Secondary | ICD-10-CM | POA: Diagnosis not present

## 2020-10-31 DIAGNOSIS — M109 Gout, unspecified: Secondary | ICD-10-CM | POA: Diagnosis not present

## 2020-10-31 DIAGNOSIS — M8589 Other specified disorders of bone density and structure, multiple sites: Secondary | ICD-10-CM | POA: Diagnosis not present

## 2020-10-31 DIAGNOSIS — E785 Hyperlipidemia, unspecified: Secondary | ICD-10-CM | POA: Diagnosis not present

## 2020-10-31 DIAGNOSIS — R7301 Impaired fasting glucose: Secondary | ICD-10-CM | POA: Diagnosis not present

## 2020-10-31 DIAGNOSIS — Z125 Encounter for screening for malignant neoplasm of prostate: Secondary | ICD-10-CM | POA: Diagnosis not present

## 2020-10-31 DIAGNOSIS — M859 Disorder of bone density and structure, unspecified: Secondary | ICD-10-CM | POA: Diagnosis not present

## 2020-11-06 DIAGNOSIS — M8589 Other specified disorders of bone density and structure, multiple sites: Secondary | ICD-10-CM | POA: Diagnosis not present

## 2020-11-06 DIAGNOSIS — R7301 Impaired fasting glucose: Secondary | ICD-10-CM | POA: Diagnosis not present

## 2020-11-06 DIAGNOSIS — R82998 Other abnormal findings in urine: Secondary | ICD-10-CM | POA: Diagnosis not present

## 2020-11-06 DIAGNOSIS — D6869 Other thrombophilia: Secondary | ICD-10-CM | POA: Diagnosis not present

## 2020-11-06 DIAGNOSIS — B1910 Unspecified viral hepatitis B without hepatic coma: Secondary | ICD-10-CM | POA: Diagnosis not present

## 2020-11-06 DIAGNOSIS — N401 Enlarged prostate with lower urinary tract symptoms: Secondary | ICD-10-CM | POA: Diagnosis not present

## 2020-11-06 DIAGNOSIS — Z1212 Encounter for screening for malignant neoplasm of rectum: Secondary | ICD-10-CM | POA: Diagnosis not present

## 2020-11-06 DIAGNOSIS — Z0000 Encounter for general adult medical examination without abnormal findings: Secondary | ICD-10-CM | POA: Diagnosis not present

## 2020-11-06 DIAGNOSIS — M109 Gout, unspecified: Secondary | ICD-10-CM | POA: Diagnosis not present

## 2020-11-06 DIAGNOSIS — M8580 Other specified disorders of bone density and structure, unspecified site: Secondary | ICD-10-CM | POA: Diagnosis not present

## 2020-11-06 DIAGNOSIS — Z1331 Encounter for screening for depression: Secondary | ICD-10-CM | POA: Diagnosis not present

## 2020-11-06 DIAGNOSIS — M79676 Pain in unspecified toe(s): Secondary | ICD-10-CM | POA: Diagnosis not present

## 2020-11-06 DIAGNOSIS — I480 Paroxysmal atrial fibrillation: Secondary | ICD-10-CM | POA: Diagnosis not present

## 2021-01-07 DIAGNOSIS — L570 Actinic keratosis: Secondary | ICD-10-CM | POA: Diagnosis not present

## 2021-02-10 DIAGNOSIS — Z23 Encounter for immunization: Secondary | ICD-10-CM | POA: Diagnosis not present

## 2021-03-18 DIAGNOSIS — Z20822 Contact with and (suspected) exposure to covid-19: Secondary | ICD-10-CM | POA: Diagnosis not present

## 2021-06-07 DIAGNOSIS — Z23 Encounter for immunization: Secondary | ICD-10-CM | POA: Diagnosis not present

## 2021-06-13 DIAGNOSIS — R972 Elevated prostate specific antigen [PSA]: Secondary | ICD-10-CM | POA: Diagnosis not present

## 2021-06-25 DIAGNOSIS — N401 Enlarged prostate with lower urinary tract symptoms: Secondary | ICD-10-CM | POA: Diagnosis not present

## 2021-06-25 DIAGNOSIS — R3912 Poor urinary stream: Secondary | ICD-10-CM | POA: Diagnosis not present

## 2021-06-25 DIAGNOSIS — R972 Elevated prostate specific antigen [PSA]: Secondary | ICD-10-CM | POA: Diagnosis not present

## 2021-07-08 DIAGNOSIS — Z961 Presence of intraocular lens: Secondary | ICD-10-CM | POA: Diagnosis not present

## 2021-07-08 DIAGNOSIS — H04123 Dry eye syndrome of bilateral lacrimal glands: Secondary | ICD-10-CM | POA: Diagnosis not present

## 2021-07-10 DIAGNOSIS — B078 Other viral warts: Secondary | ICD-10-CM | POA: Diagnosis not present

## 2021-07-10 DIAGNOSIS — L821 Other seborrheic keratosis: Secondary | ICD-10-CM | POA: Diagnosis not present

## 2021-07-10 DIAGNOSIS — L570 Actinic keratosis: Secondary | ICD-10-CM | POA: Diagnosis not present

## 2021-07-10 DIAGNOSIS — D1801 Hemangioma of skin and subcutaneous tissue: Secondary | ICD-10-CM | POA: Diagnosis not present

## 2021-07-10 DIAGNOSIS — L80 Vitiligo: Secondary | ICD-10-CM | POA: Diagnosis not present

## 2021-09-05 IMAGING — CT CT CARDIAC CORONARY ARTERY CALCIUM SCORE
3 series · 14 of 20 positions shown, 16 images · non-contrast
Comparison: None.

CLINICAL DATA: Hyperlipidemia

EXAM:
CT CARDIAC CORONARY ARTERY CALCIUM SCORE
TECHNIQUE: Non-contrast imaging through the heart was performed using
prospective ECG gating. Image post processing was performed on an
independent workstation, allowing for quantitative analysis of the
heart and coronary arteries. Note that this exam targets the heart
and the chest was not imaged in its entirety.

[Series 2: calcium scoring 2.00 qr36 bestdiast 69% hrt calciu · axial · 0.39mm/px · z∈[+1566,+1674]mm · 4 of 90 slices shown]
[im 18/90  vessel]
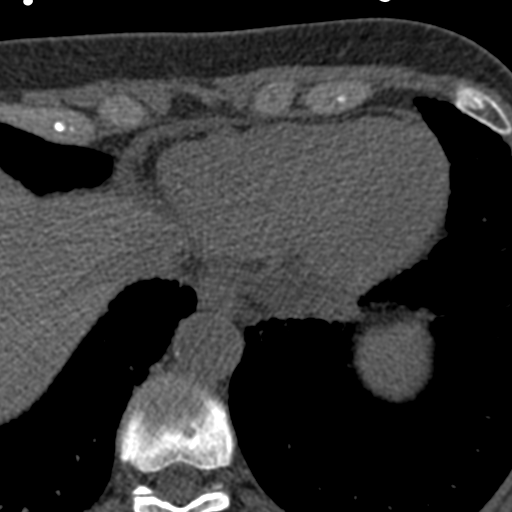
[im 36/90  vessel]
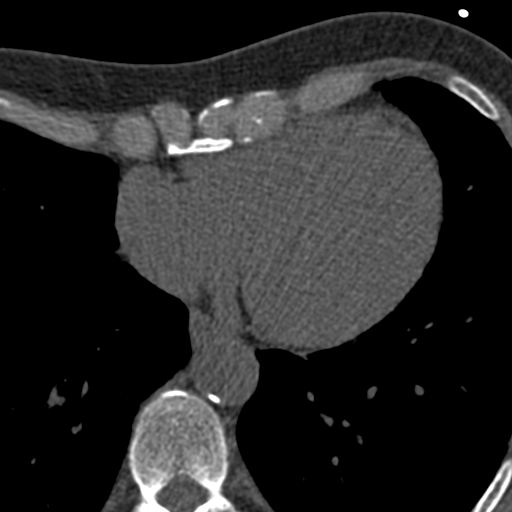
[im 54/90  vessel]
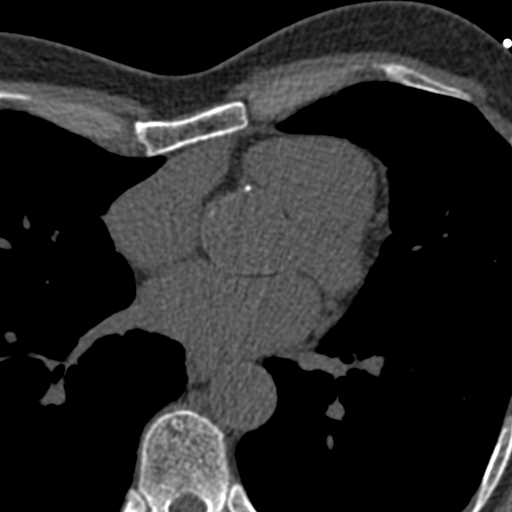
[im 72/90  vessel]
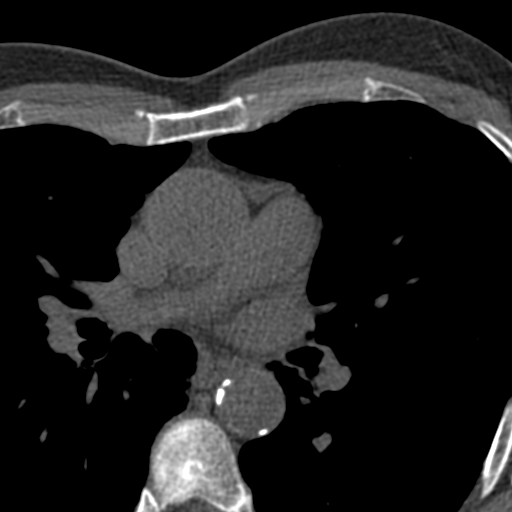

[Series 3: calcium scoring 2.00 br40 bestdiast 69% axial · axial · 0.66mm/px · z∈[+1560,+1680]mm · 5 of 90 slices shown, 7 images]
[im 15/90  vessel]
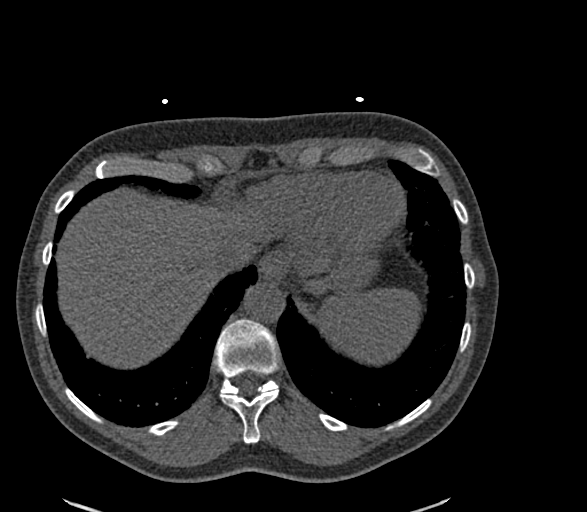
[im 15/90  lung]
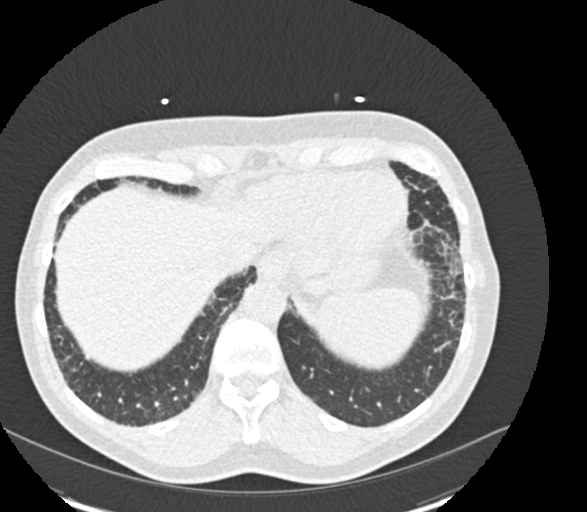
[im 30/90  vessel]
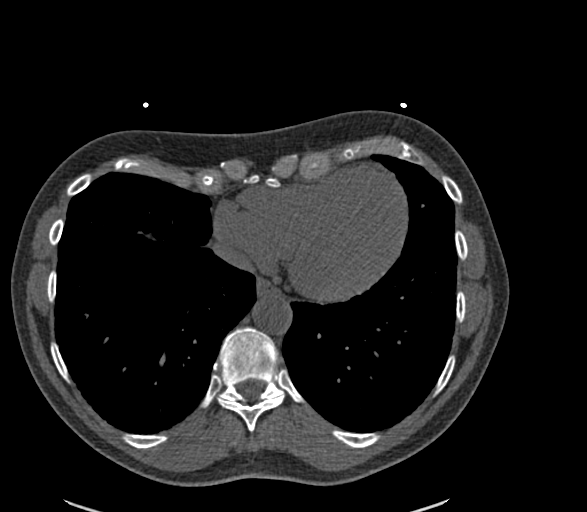
[im 45/90  vessel]
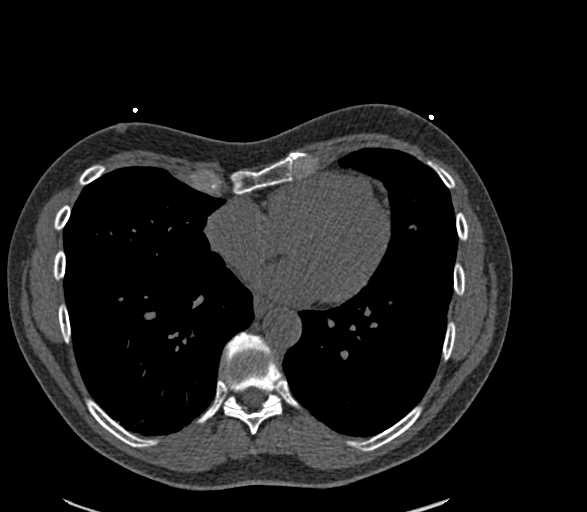
[im 60/90  vessel]
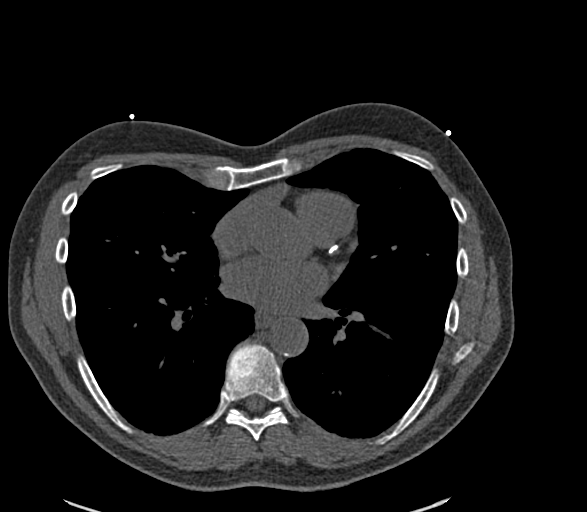
[im 75/90  vessel]
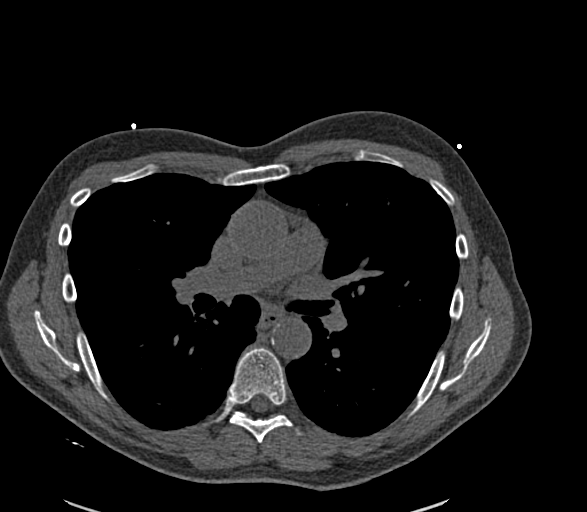
[im 75/90  lung]
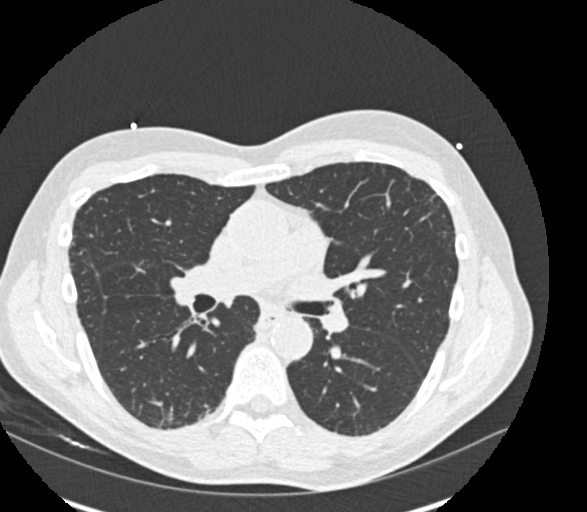

[Series 9: calcium scoring 2.00 br60 bestdiast 69% lungs · axial · 0.66mm/px · z∈[+1560,+1680]mm · 5 of 90 slices shown]
[im 15/90  vessel]
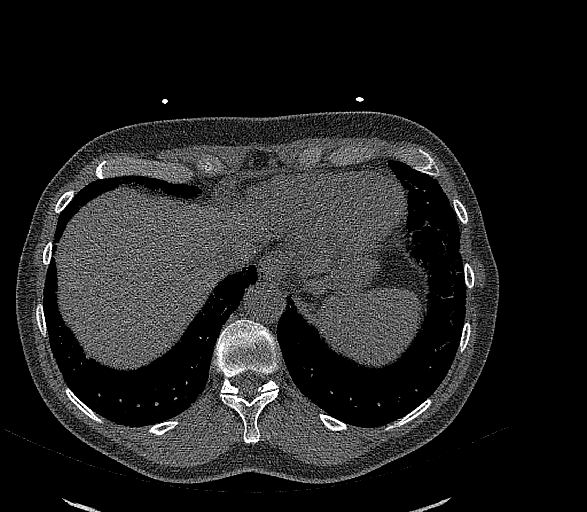
[im 30/90  vessel]
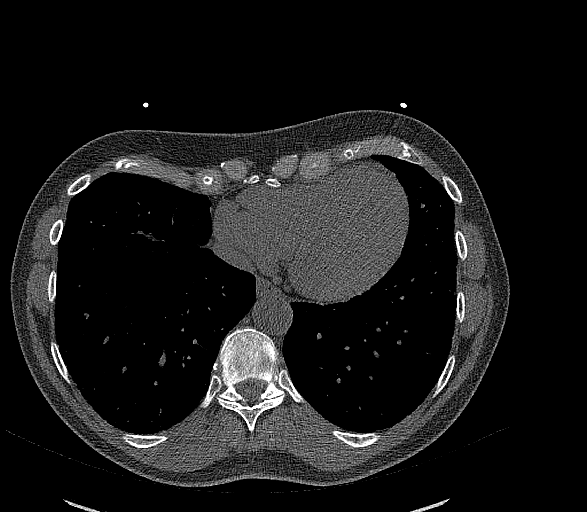
[im 45/90  vessel]
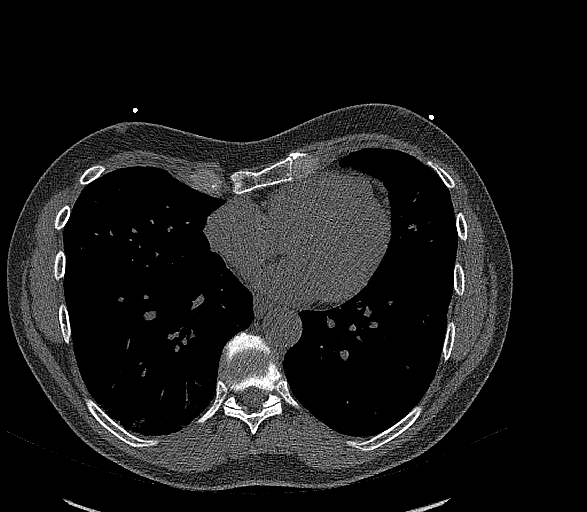
[im 60/90  vessel]
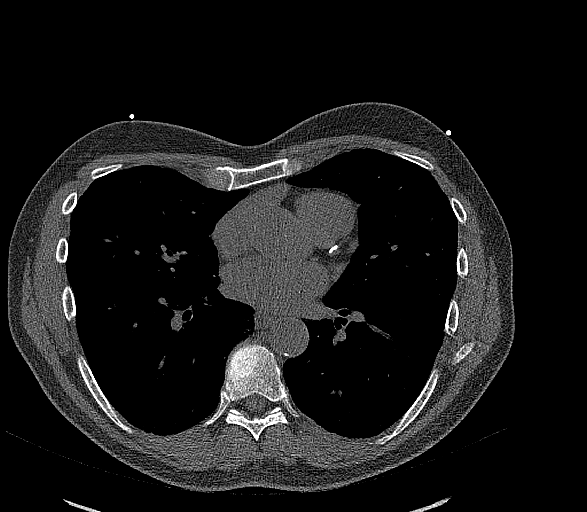
[im 75/90  vessel]
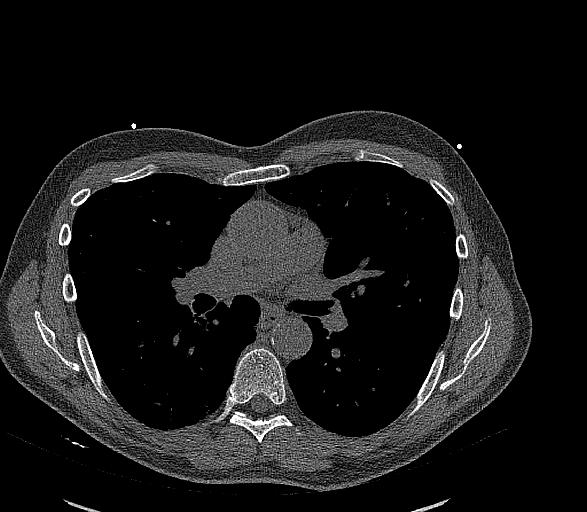

[14 of 20 positions shown; findings below may reference images not displayed]

FINDINGS: CORONARY CALCIUM SCORES:

Left Main: 0

LAD:

LCx: 0

RCA:

Total Agatston Score:

[HOSPITAL] percentile: 49

AORTA MEASUREMENTS:

Ascending Aorta: 38 mm

Descending Aorta: 27 mm

OTHER FINDINGS:

Heart is normal size. Calcifications in the aortic root and
visualized descending thoracic aorta. No adenopathy in the lower
mediastinum or hila. Scarring in the lung bases. No confluent
opacities or effusions. Imaging into the upper abdomen shows no
acute findings. Chest wall soft tissues are unremarkable. No acute
bony abnormality.
IMPRESSION: The observed calcium score of 241.5 is at the percentile 49 for
subjects of the same age, gender and race/ethnicity who are free of
clinical cardiovascular disease and treated diabetes.

Aortic atherosclerosis.

## 2021-10-09 DIAGNOSIS — L570 Actinic keratosis: Secondary | ICD-10-CM | POA: Diagnosis not present

## 2021-11-27 DIAGNOSIS — E785 Hyperlipidemia, unspecified: Secondary | ICD-10-CM | POA: Diagnosis not present

## 2021-11-27 DIAGNOSIS — M109 Gout, unspecified: Secondary | ICD-10-CM | POA: Diagnosis not present

## 2021-11-27 DIAGNOSIS — Z125 Encounter for screening for malignant neoplasm of prostate: Secondary | ICD-10-CM | POA: Diagnosis not present

## 2021-11-27 DIAGNOSIS — R7301 Impaired fasting glucose: Secondary | ICD-10-CM | POA: Diagnosis not present

## 2021-12-03 DIAGNOSIS — H903 Sensorineural hearing loss, bilateral: Secondary | ICD-10-CM | POA: Diagnosis not present

## 2021-12-04 DIAGNOSIS — N401 Enlarged prostate with lower urinary tract symptoms: Secondary | ICD-10-CM | POA: Diagnosis not present

## 2021-12-04 DIAGNOSIS — E785 Hyperlipidemia, unspecified: Secondary | ICD-10-CM | POA: Diagnosis not present

## 2021-12-04 DIAGNOSIS — D126 Benign neoplasm of colon, unspecified: Secondary | ICD-10-CM | POA: Diagnosis not present

## 2021-12-04 DIAGNOSIS — Z0000 Encounter for general adult medical examination without abnormal findings: Secondary | ICD-10-CM | POA: Diagnosis not present

## 2021-12-04 DIAGNOSIS — R82998 Other abnormal findings in urine: Secondary | ICD-10-CM | POA: Diagnosis not present

## 2021-12-04 DIAGNOSIS — Z1339 Encounter for screening examination for other mental health and behavioral disorders: Secondary | ICD-10-CM | POA: Diagnosis not present

## 2021-12-04 DIAGNOSIS — M8580 Other specified disorders of bone density and structure, unspecified site: Secondary | ICD-10-CM | POA: Diagnosis not present

## 2021-12-04 DIAGNOSIS — Z1331 Encounter for screening for depression: Secondary | ICD-10-CM | POA: Diagnosis not present

## 2021-12-04 DIAGNOSIS — Z1212 Encounter for screening for malignant neoplasm of rectum: Secondary | ICD-10-CM | POA: Diagnosis not present

## 2021-12-04 DIAGNOSIS — R7301 Impaired fasting glucose: Secondary | ICD-10-CM | POA: Diagnosis not present

## 2022-01-09 DIAGNOSIS — M25511 Pain in right shoulder: Secondary | ICD-10-CM | POA: Diagnosis not present

## 2022-01-13 ENCOUNTER — Ambulatory Visit (INDEPENDENT_AMBULATORY_CARE_PROVIDER_SITE_OTHER): Payer: Medicare Other | Admitting: Gastroenterology

## 2022-01-13 ENCOUNTER — Encounter: Payer: Self-pay | Admitting: Gastroenterology

## 2022-01-13 VITALS — HR 65 | Ht 71.0 in | Wt 162.2 lb

## 2022-01-13 DIAGNOSIS — Z86010 Personal history of colonic polyps: Secondary | ICD-10-CM | POA: Diagnosis not present

## 2022-01-13 NOTE — Patient Instructions (Signed)
If you are age 78 or older, your body mass index should be between 23-30. Your Body mass index is 22.63 kg/m?Marland Kitchen If this is out of the aforementioned range listed, please consider follow up with your Primary Care Provider. ?________________________________________________________ ? ?The [facility] providers would like to encourage you to use [facility] to communicate with providers for non-urgent requests or questions.  Due to long hold times on the telephone, sending your provider a message by [facility] may be a faster and more efficient way to get a response.  Please allow 48 business hours for a response.  Please remember that this is for non-urgent requests.  ?_______________________________________________________ ? ?You have been scheduled for a colonoscopy. Please follow written instructions given to you at your visit today.  ?Please pick up your prep supplies at the pharmacy within the next 1-3 days. ?If you use inhalers (even only as needed), please bring them with you on the day of your procedure. ? ?Due to recent changes in healthcare laws, you may see the results of your imaging and laboratory studies on MyChart before your provider has had a chance to review them.  We understand that in some cases there may be results that are confusing or concerning to you. Not all laboratory results come back in the same time frame and the provider may be waiting for multiple results in order to interpret others.  Please give [place] 48 hours in order for your provider to thoroughly review all the results before contacting the office for clarification of your results.  ? ?Thank you for entrusting me with your care and choosing [facility]. ? ?Dr Ardis Hughs ? ?

## 2022-01-13 NOTE — Progress Notes (Signed)
Review of pertinent gastrointestinal problems: ?1.  Adenomatous colon polyps.  Colonoscopy 2016 Dr. Ardis Hughs found and removed 2 subcentimeter adenomas.  Also noted left-sided diverticulosis.  Colonoscopy 10/2009 Dr. Verl Blalock found diverticulosis, no polyps.  Colonoscopy 2006 Dr. Verl Blalock found to diminutive polyps.  These were hyperplastic.  He was recommended to have repeat colonoscopy at 5-year interval for "family history of colon polyps" ? ?HPI: ?This is a very pleasant 76 year old man, former colleague Education officer, environmental here in town. ? ?He is in great overall health.  He hikes, walks just about every day.  He plays golf at least once or twice a week.  He eats well, he stays very active in [employer]. ? ?He has had 2 brief episodes of atrial fibrillation.  He is not on anticoagulation but believes that if he has a third episode he will probably start anticoagulation. ? ?He has no issues with his GI tract. ? ? ?ROS: complete GI ROS as described in HPI, all other review negative. ? ?Constitutional:  No unintentional weight loss ? ? ?Past Medical History:  ?Diagnosis Date  ? Allergy   ? Cataract   ? bil cateracts removed  ? Hepatitis B   ? 1976  ? ? ?Past Surgical History:  ?Procedure Laterality Date  ? bilateral cateracts removal    ? COLONOSCOPY    ? EYE SURGERY    ? tonillectomy and adnoidectomy    ? ? ?Current Outpatient Medications  ?Medication Instructions  ? atorvastatin (LIPITOR) 40 mg, Oral, Daily  ? Cetirizine HCl (ZYRTEC ALLERGY) 10 MG CAPS 1 tablet, Oral, As needed  ? Cholecalciferol (VITAMIN D3) 1.25 MG (50000 UT) TABS 1 tablet, Oral, Weekly  ? fluticasone (FLONASE) 50 MCG/ACT nasal spray Each Nare, As needed  ? mupirocin ointment (BACTROBAN) 2 % Apply to affected area TID as needed External  ? tamsulosin (FLOMAX) 0.4 mg  ? VIAGRA 100 MG tablet See admin instructions  ? ? ?Allergies as of 01/13/2022  ? (No Known Allergies)  ? ? ?Family History  ?Problem Relation Age of Onset  ?  Heart disease Mother   ? Hypertension Mother   ? Heart disease Father   ? Hypertension Father   ? Ovarian cancer Maternal Aunt   ? Uterine cancer Maternal Aunt   ? Stomach cancer Cousin 34  ?     materal 1st cousin  ? Colon polyps Son   ? Colon cancer Neg Hx   ? Esophageal cancer Neg Hx   ? Rectal cancer Neg Hx   ? ? ?Social History  ? ?Socioeconomic History  ? Marital status: Married  ?  Spouse name: Not on file  ? Number of children: 2  ? Years of education: Not on file  ? Highest education level: Not on file  ?Occupational History  ? Occupation: physician  ?Tobacco Use  ? Smoking status: Never  ? Smokeless tobacco: Never  ?Vaping Use  ? Vaping Use: Never used  ?Substance and Sexual Activity  ? Alcohol use: Yes  ?  Alcohol/week: 10.0 standard drinks  ?  Types: 10 Glasses of wine per week  ? Drug use: No  ? Sexual activity: Not on file  ?Other Topics Concern  ? Not on file  ?Social History Narrative  ? Not on file  ? ?Social Determinants of Health  ? ?Financial Resource Strain: Not on file  ?Food Insecurity: Not on file  ?Transportation Needs: Not on file  ?Physical Activity: Not on file  ?Stress: Not on file  ?  Social Connections: Not on file  ?Intimate Partner Violence: Not on file  ? ? ? ?Physical Exam: ?Pulse 65   Ht '5\' 11"'$  (1.803 m) Comment: height measured without shoes  Wt 162 lb 4 oz (73.6 kg)   BMI 22.63 kg/m?  ?Constitutional: generally well-appearing ?Psychiatric: alert and oriented x3 ?Abdomen: soft, nontender, nondistended, no obvious ascites, no peritoneal signs, normal bowel sounds ?No peripheral edema noted in lower extremities ? ?Assessment and plan: ?76 y.o. male with personal history of adenomatous polyps, family history of polyps. ? ?He is in great overall health and I do feel that colon cancer screening, polyp surveillance is still quite a relevant issue for him.  I recommended colonoscopy at his soonest convenience.  He agreed.  I see no reason for blood tests or imaging studies prior to  then. ? ?He gets annual stool testing through his primary care physician.  This truly is not necessary, it is actually considered over screening for colon cancer since he is on polyp surveillance regimen. ? ?Please see the "Patient Instructions" section for addition details about the plan. ? ?Owens Loffler, MD ?[facility] ?01/13/2022, 2:42 PM ? ? ?Total time on date of encounter was 30 minutes (this included time spent preparing to see the patient reviewing records; obtaining and/or reviewing separately obtained history; performing a medically appropriate exam and/or evaluation; counseling and educating the patient and family if present; ordering medications, tests or procedures if applicable; and documenting clinical information in the health record). ? ? ? ?

## 2022-01-21 ENCOUNTER — Ambulatory Visit (AMBULATORY_SURGERY_CENTER): Payer: Medicare Other | Admitting: Gastroenterology

## 2022-01-21 ENCOUNTER — Encounter: Payer: Self-pay | Admitting: Gastroenterology

## 2022-01-21 VITALS — BP 138/82 | HR 59 | Temp 97.3°F | Resp 13 | Ht 71.0 in | Wt 162.0 lb

## 2022-01-21 DIAGNOSIS — Z86010 Personal history of colonic polyps: Secondary | ICD-10-CM | POA: Diagnosis not present

## 2022-01-21 MED ORDER — SODIUM CHLORIDE 0.9 % IV SOLN: 500.0000 mL | Freq: Once | INTRAVENOUS | Status: DC

## 2022-01-21 NOTE — Patient Instructions (Signed)
Handout provided on diverticulosis.   YOU HAD AN ENDOSCOPIC PROCEDURE TODAY AT THE [facility]:   Refer to the procedure report that was given to you for any specific questions about what was found during the examination.  If the procedure report does not answer your questions, please call your gastroenterologist to clarify.  If you requested that your care partner not be given the details of your procedure findings, then the procedure report has been included in a sealed envelope for you to review at your convenience later.  YOU SHOULD EXPECT: Some feelings of bloating in the abdomen. Passage of more gas than usual.  Walking can help get rid of the air that was put into your GI tract during the procedure and reduce the bloating. If you had a lower endoscopy (such as a colonoscopy or flexible sigmoidoscopy) you may notice spotting of blood in your stool or on the toilet paper. If you underwent a bowel prep for your procedure, you may not have a normal bowel movement for a few days.  Please Note:  You might notice some irritation and congestion in your nose or some drainage.  This is from the oxygen used during your procedure.  There is no need for concern and it should clear up in a day or so.  SYMPTOMS TO REPORT IMMEDIATELY:  Following lower endoscopy (colonoscopy or flexible sigmoidoscopy):  Excessive amounts of blood in the stool  Significant tenderness or worsening of abdominal pains  Swelling of the abdomen that is new, acute  Fever of 100F or higher  For urgent or emergent issues, a gastroenterologist can be reached at any hour by calling [phone]. Do not use MyChart messaging for urgent concerns.    DIET:  We do recommend a small meal at first, but then you may proceed to your regular diet.  Drink plenty of fluids but you should avoid alcoholic beverages for 24 hours.  ACTIVITY:  You should plan to take it easy for the rest of today and you should NOT DRIVE or use  heavy machinery until tomorrow (because of the sedation medicines used during the test).    FOLLOW UP: Our staff will call the number listed on your records 48-72 hours following your procedure to check on you and address any questions or concerns that you may have regarding the information given to you following your procedure. If we do not reach you, we will leave a message.  We will attempt to reach you two times.  During this call, we will ask if you have developed any symptoms of COVID 19. If you develop any symptoms (ie: fever, flu-like symptoms, shortness of breath, cough etc.) before then, please call [phone].  If you test positive for Covid 19 in the 2 weeks post procedure, please call and report this information to us.    If any biopsies were taken you will be contacted by phone or by letter within the next 1-3 weeks.  Please call us at [phone] if you have not heard about the biopsies in 3 weeks.    SIGNATURES/CONFIDENTIALITY: You and/or your care partner have signed paperwork which will be entered into your electronic medical record.  These signatures attest to the fact that that the information above on your After Visit Summary has been reviewed and is understood.  Full responsibility of the confidentiality of this discharge information lies with you and/or your care-partner.  

## 2022-01-21 NOTE — Op Note (Signed)
[facility] Patient Name: Thomas Ponce Procedure Date: 01/21/2022 2:20 PM MRN: [id number] Endoscopist: Milus Banister , MD Age: 76 Referring MD:  Date of Birth: May 27, 1946 Gender: Male Account #: [id number] Procedure:                Colonoscopy Indications:              High risk colon cancer surveillance: Personal                            history of colonic polyps; Colonoscopy 2016 Dr.                            Ardis Hughs found and removed 2 subcentimeter adenomas.                            Also noted left-sided diverticulosis. Colonoscopy                            10/2009 Dr. Verl Blalock found diverticulosis, no                            polyps. Colonoscopy 2006 Dr. Verl Blalock found                            two diminutive polyps. These were hyperplastic. Medicines:                Monitored Anesthesia Care Procedure:                Pre-Anesthesia Assessment:                           - Prior to the procedure, a History and Physical                            was performed, and patient medications and                            allergies were reviewed. The patient's tolerance of                            previous anesthesia was also reviewed. The risks                            and benefits of the procedure and the sedation                            options and risks were discussed with the patient.                            All questions were answered, and informed consent                            was obtained. Prior Anticoagulants: The patient has  taken no previous anticoagulant or antiplatelet                            agents. ASA Grade Assessment: II - A patient with                            mild systemic disease. After reviewing the risks                            and benefits, the patient was deemed in                            satisfactory condition to undergo the procedure.                           After  obtaining informed consent, the colonoscope                            was passed under direct vision. Throughout the                            procedure, the patient's blood pressure, pulse, and                            oxygen saturations were monitored continuously. The                            Olympus CF-HQ190L [id number]) Colonoscope was                            introduced through the anus and advanced to the the                            cecum, identified by appendiceal orifice and                            ileocecal valve. The colonoscopy was performed                            without difficulty. The patient tolerated the                            procedure well. The quality of the bowel                            preparation was good. The ileocecal valve,                            appendiceal orifice, and rectum were photographed. Scope In: 2:26:49 PM Scope Out: 2:41:56 PM Scope Withdrawal Time: 0 hours 7 minutes 17 seconds  Total Procedure Duration: 0 hours 15 minutes 7 seconds  Findings:                 Multiple small and large-mouthed diverticula were  found in the left colon.                           The exam was otherwise without abnormality on                            direct and retroflexion views. Complications:            No immediate complications. Estimated blood loss:                            None. Estimated Blood Loss:     Estimated blood loss: none. Impression:               - Diverticulosis in the left colon.                           - The examination was otherwise normal on direct                            and retroflexion views.                           - No polyps or cancers. Recommendation:           - Patient has a contact number available for                            emergencies. The signs and symptoms of potential                            delayed complications were discussed with the                             patient. Return to normal activities tomorrow.                            Written discharge instructions were provided to the                            patient.                           - Resume previous diet.                           - Continue present medications.                           - You do not need any further colon cancer                            screening tests (including stool testing). These                            types of tests generally stop around age 81-80. Milus Banister, MD 01/21/2022 2:45:05 PM This report has been signed electronically.

## 2022-01-21 NOTE — Progress Notes (Signed)
  The recent H&P (dated this month) was reviewed, the patient was examined and there is no change in the patients condition since that H&P was completed.   Thomas Ponce  01/21/2022, 1:57 PM '

## 2022-01-21 NOTE — Progress Notes (Signed)
Pt's states no medical or surgical changes since previsit or office visit. 

## 2022-01-21 NOTE — Progress Notes (Signed)
To pacu, VSS. Report to [email or web address] 

## 2022-01-22 ENCOUNTER — Telehealth: Payer: Self-pay | Admitting: *Deleted

## 2022-01-22 NOTE — Telephone Encounter (Signed)
  Follow up Call-     01/21/2022    1:49 PM  Call back number  Post procedure Call Back phone  # [phone]  Permission to leave phone message Yes     Patient questions:  Do you have a fever, pain , or abdominal swelling? No. Pain Score  0 *  Have you tolerated food without any problems? Yes.    Have you been able to return to your normal activities? Yes.    Do you have any questions about your discharge instructions: Diet   No. Medications  No. Follow up visit  No.  Do you have questions or concerns about your Care? No.  Actions: * If pain score is 4 or above: No action needed, pain <4.

## 2022-02-16 DIAGNOSIS — M25511 Pain in right shoulder: Secondary | ICD-10-CM | POA: Diagnosis not present

## 2022-02-26 DIAGNOSIS — H6123 Impacted cerumen, bilateral: Secondary | ICD-10-CM | POA: Diagnosis not present

## 2022-02-26 DIAGNOSIS — H9190 Unspecified hearing loss, unspecified ear: Secondary | ICD-10-CM | POA: Diagnosis not present

## 2022-02-26 DIAGNOSIS — Z23 Encounter for immunization: Secondary | ICD-10-CM | POA: Diagnosis not present

## 2022-06-06 DIAGNOSIS — Z23 Encounter for immunization: Secondary | ICD-10-CM | POA: Diagnosis not present

## 2022-07-27 DIAGNOSIS — Z23 Encounter for immunization: Secondary | ICD-10-CM | POA: Diagnosis not present

## 2022-08-26 DIAGNOSIS — R972 Elevated prostate specific antigen [PSA]: Secondary | ICD-10-CM | POA: Diagnosis not present

## 2022-09-01 DIAGNOSIS — N401 Enlarged prostate with lower urinary tract symptoms: Secondary | ICD-10-CM | POA: Diagnosis not present

## 2022-09-01 DIAGNOSIS — R972 Elevated prostate specific antigen [PSA]: Secondary | ICD-10-CM | POA: Diagnosis not present

## 2022-09-01 DIAGNOSIS — N5201 Erectile dysfunction due to arterial insufficiency: Secondary | ICD-10-CM | POA: Diagnosis not present

## 2022-09-01 DIAGNOSIS — R3912 Poor urinary stream: Secondary | ICD-10-CM | POA: Diagnosis not present

## 2022-12-29 DIAGNOSIS — M109 Gout, unspecified: Secondary | ICD-10-CM | POA: Diagnosis not present

## 2022-12-29 DIAGNOSIS — R7301 Impaired fasting glucose: Secondary | ICD-10-CM | POA: Diagnosis not present

## 2022-12-29 DIAGNOSIS — E785 Hyperlipidemia, unspecified: Secondary | ICD-10-CM | POA: Diagnosis not present

## 2022-12-29 DIAGNOSIS — R7989 Other specified abnormal findings of blood chemistry: Secondary | ICD-10-CM | POA: Diagnosis not present

## 2022-12-29 DIAGNOSIS — M859 Disorder of bone density and structure, unspecified: Secondary | ICD-10-CM | POA: Diagnosis not present

## 2022-12-29 DIAGNOSIS — Z125 Encounter for screening for malignant neoplasm of prostate: Secondary | ICD-10-CM | POA: Diagnosis not present

## 2022-12-29 DIAGNOSIS — M8589 Other specified disorders of bone density and structure, multiple sites: Secondary | ICD-10-CM | POA: Diagnosis not present

## 2022-12-29 DIAGNOSIS — R3589 Other polyuria: Secondary | ICD-10-CM | POA: Diagnosis not present

## 2023-01-05 DIAGNOSIS — H6123 Impacted cerumen, bilateral: Secondary | ICD-10-CM | POA: Diagnosis not present

## 2023-01-05 DIAGNOSIS — H9193 Unspecified hearing loss, bilateral: Secondary | ICD-10-CM | POA: Diagnosis not present

## 2023-01-05 DIAGNOSIS — N401 Enlarged prostate with lower urinary tract symptoms: Secondary | ICD-10-CM | POA: Diagnosis not present

## 2023-01-05 DIAGNOSIS — E785 Hyperlipidemia, unspecified: Secondary | ICD-10-CM | POA: Diagnosis not present

## 2023-01-05 DIAGNOSIS — R7301 Impaired fasting glucose: Secondary | ICD-10-CM | POA: Diagnosis not present

## 2023-01-05 DIAGNOSIS — I480 Paroxysmal atrial fibrillation: Secondary | ICD-10-CM | POA: Diagnosis not present

## 2023-01-05 DIAGNOSIS — D6869 Other thrombophilia: Secondary | ICD-10-CM | POA: Diagnosis not present

## 2023-01-05 DIAGNOSIS — Z0000 Encounter for general adult medical examination without abnormal findings: Secondary | ICD-10-CM | POA: Diagnosis not present

## 2023-01-05 DIAGNOSIS — D126 Benign neoplasm of colon, unspecified: Secondary | ICD-10-CM | POA: Diagnosis not present

## 2023-01-05 DIAGNOSIS — M8580 Other specified disorders of bone density and structure, unspecified site: Secondary | ICD-10-CM | POA: Diagnosis not present

## 2023-06-08 DIAGNOSIS — Z23 Encounter for immunization: Secondary | ICD-10-CM | POA: Diagnosis not present

## 2023-07-19 DIAGNOSIS — Z23 Encounter for immunization: Secondary | ICD-10-CM | POA: Diagnosis not present

## 2023-09-06 DIAGNOSIS — R972 Elevated prostate specific antigen [PSA]: Secondary | ICD-10-CM | POA: Diagnosis not present

## 2023-09-10 DIAGNOSIS — N5201 Erectile dysfunction due to arterial insufficiency: Secondary | ICD-10-CM | POA: Diagnosis not present

## 2023-09-10 DIAGNOSIS — N401 Enlarged prostate with lower urinary tract symptoms: Secondary | ICD-10-CM | POA: Diagnosis not present

## 2023-09-10 DIAGNOSIS — R3912 Poor urinary stream: Secondary | ICD-10-CM | POA: Diagnosis not present

## 2023-09-10 DIAGNOSIS — R972 Elevated prostate specific antigen [PSA]: Secondary | ICD-10-CM | POA: Diagnosis not present

## 2023-12-20 DIAGNOSIS — S0502XA Injury of conjunctiva and corneal abrasion without foreign body, left eye, initial encounter: Secondary | ICD-10-CM | POA: Diagnosis not present

## 2023-12-22 DIAGNOSIS — S0502XA Injury of conjunctiva and corneal abrasion without foreign body, left eye, initial encounter: Secondary | ICD-10-CM | POA: Diagnosis not present

## 2024-01-27 DIAGNOSIS — H903 Sensorineural hearing loss, bilateral: Secondary | ICD-10-CM | POA: Diagnosis not present

## 2024-03-22 DIAGNOSIS — R7301 Impaired fasting glucose: Secondary | ICD-10-CM | POA: Diagnosis not present

## 2024-03-22 DIAGNOSIS — M109 Gout, unspecified: Secondary | ICD-10-CM | POA: Diagnosis not present

## 2024-03-22 DIAGNOSIS — N401 Enlarged prostate with lower urinary tract symptoms: Secondary | ICD-10-CM | POA: Diagnosis not present

## 2024-03-22 DIAGNOSIS — Z1212 Encounter for screening for malignant neoplasm of rectum: Secondary | ICD-10-CM | POA: Diagnosis not present

## 2024-03-22 DIAGNOSIS — R945 Abnormal results of liver function studies: Secondary | ICD-10-CM | POA: Diagnosis not present

## 2024-03-22 DIAGNOSIS — M8580 Other specified disorders of bone density and structure, unspecified site: Secondary | ICD-10-CM | POA: Diagnosis not present

## 2024-03-22 DIAGNOSIS — E785 Hyperlipidemia, unspecified: Secondary | ICD-10-CM | POA: Diagnosis not present

## 2024-04-01 DIAGNOSIS — E785 Hyperlipidemia, unspecified: Secondary | ICD-10-CM | POA: Diagnosis not present

## 2024-04-01 DIAGNOSIS — Z1212 Encounter for screening for malignant neoplasm of rectum: Secondary | ICD-10-CM | POA: Diagnosis not present

## 2024-04-04 DIAGNOSIS — Z1331 Encounter for screening for depression: Secondary | ICD-10-CM | POA: Diagnosis not present

## 2024-04-04 DIAGNOSIS — R609 Edema, unspecified: Secondary | ICD-10-CM | POA: Diagnosis not present

## 2024-04-04 DIAGNOSIS — E785 Hyperlipidemia, unspecified: Secondary | ICD-10-CM | POA: Diagnosis not present

## 2024-04-04 DIAGNOSIS — Z0000 Encounter for general adult medical examination without abnormal findings: Secondary | ICD-10-CM | POA: Diagnosis not present

## 2024-04-04 DIAGNOSIS — H9193 Unspecified hearing loss, bilateral: Secondary | ICD-10-CM | POA: Diagnosis not present

## 2024-04-04 DIAGNOSIS — D126 Benign neoplasm of colon, unspecified: Secondary | ICD-10-CM | POA: Diagnosis not present

## 2024-04-04 DIAGNOSIS — M8580 Other specified disorders of bone density and structure, unspecified site: Secondary | ICD-10-CM | POA: Diagnosis not present

## 2024-04-04 DIAGNOSIS — I480 Paroxysmal atrial fibrillation: Secondary | ICD-10-CM | POA: Diagnosis not present

## 2024-04-04 DIAGNOSIS — H9190 Unspecified hearing loss, unspecified ear: Secondary | ICD-10-CM | POA: Diagnosis not present

## 2024-04-04 DIAGNOSIS — M109 Gout, unspecified: Secondary | ICD-10-CM | POA: Diagnosis not present

## 2024-04-04 DIAGNOSIS — N401 Enlarged prostate with lower urinary tract symptoms: Secondary | ICD-10-CM | POA: Diagnosis not present

## 2024-04-04 DIAGNOSIS — R82998 Other abnormal findings in urine: Secondary | ICD-10-CM | POA: Diagnosis not present

## 2024-04-04 DIAGNOSIS — R7301 Impaired fasting glucose: Secondary | ICD-10-CM | POA: Diagnosis not present

## 2024-06-07 DIAGNOSIS — R413 Other amnesia: Secondary | ICD-10-CM | POA: Diagnosis not present

## 2024-06-07 DIAGNOSIS — E785 Hyperlipidemia, unspecified: Secondary | ICD-10-CM | POA: Diagnosis not present

## 2024-06-07 DIAGNOSIS — Z23 Encounter for immunization: Secondary | ICD-10-CM | POA: Diagnosis not present

## 2024-06-26 ENCOUNTER — Ambulatory Visit: Admitting: Neurology

## 2024-06-26 ENCOUNTER — Encounter: Payer: Self-pay | Admitting: Neurology

## 2024-06-26 VITALS — BP 138/76 | HR 70 | Ht 71.0 in | Wt 169.6 lb

## 2024-06-26 DIAGNOSIS — I480 Paroxysmal atrial fibrillation: Secondary | ICD-10-CM

## 2024-06-26 DIAGNOSIS — R413 Other amnesia: Secondary | ICD-10-CM | POA: Diagnosis not present

## 2024-06-26 DIAGNOSIS — H900 Conductive hearing loss, bilateral: Secondary | ICD-10-CM | POA: Insufficient documentation
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
## 2024-06-26 NOTE — Addendum Note (Signed)
 Addended by: CHALICE SAUNAS on: 06/26/2024 10:25 AM   Modules accepted: Orders

## 2024-06-26 NOTE — Patient Instructions (Signed)
 There are well-accepted and sensible ways to reduce risk for Alzheimers disease and other degenerative brain disorders .  Exercise Daily Walk A daily 20 minute walk should be part of your routine. Disease related apathy can be a significant roadblock to exercise and the only way to overcome this is to make it a daily routine and perhaps have a reward at the end (something your loved one loves to eat or drink perhaps) or a personal trainer coming to the home can also be very useful. Most importantly, the patient is much more likely to exercise if the caregiver / spouse does it with him/her. In general a structured, repetitive schedule is best.  General Health: Any diseases which effect your body will effect your brain such as a pneumonia, urinary infection, blood clot, heart attack or stroke. Keep contact with your primary care doctor for regular follow ups.  Sleep. A good nights sleep is healthy for the brain. Seven hours is recommended. If you have insomnia or poor sleep habits we can give you some instructions. If you have sleep apnea wear your mask.  Diet: Eating a heart healthy diet is also a good idea; fish and poultry instead of red meat, nuts (mostly non-peanuts), vegetables, fruits, olive oil or canola oil (instead of butter), minimal salt (use other spices to flavor foods), whole grain rice, bread, cereal and pasta and wine in moderation.Research is now showing that the MIND diet, which is a combination of The Mediterranean diet and the DASH diet, is beneficial for cognitive processing and longevity. Information about this diet can be found in The MIND Diet, a book by Annitta Feeling, MS, RDN, and online at wildwildscience.es  Finances, Power of [address] and Advance Directives: You should consider putting legal safeguards in place with regard to financial and medical decision making. While the spouse always has power of attorney for medical and financial issues in the  absence of any form, you should consider what you want in case the spouse / caregiver is no longer around or capable of making decisions.   The Alzheimers Association Position on Disease Prevention  Can Alzheimer's be prevented? It's a question that continues to intrigue researchers and fuel new investigations. There are no clear-cut answers yet -- partially due to the need for more large-scale studies in diverse populations -- but promising research is under way. The Alzheimer's Association is leading the worldwide effort to find a treatment for Alzheimer's, delay its onset and prevent it from developing.   What causes Alzheimer's? Experts agree that in the vast majority of cases, Alzheimer's, like other common chronic conditions, probably develops as a result of complex interactions among multiple factors, including age, genetics, environment, lifestyle and coexisting medical conditions. Although some risk factors -- such as age or genes -- cannot be changed, other risk factors -- such as high blood pressure and lack of exercise -- usually can be changed to help reduce risk. Research in these areas may lead to new ways to detect those at highest risk.  Prevention studies A small percentage of people with Alzheimer's disease (less than 1 percent) have an early-onset type associated with genetic mutations. Individuals who have these genetic mutations are guaranteed to develop the disease. An ongoing clinical trial conducted by the [facility] ([facility]), is testing whether antibodies to beta-amyloid can reduce the accumulation of beta-amyloid plaque in the brains of people with such genetic mutations and thereby reduce, delay or prevent symptoms. Participants in the trial are receiving antibodies (  or placebo) before they develop symptoms, and the development of beta-amyloid plaques is being monitored by brain scans and other tests.  Another clinical trial, known as the A4 trial  (Anti-Amyloid Treatment in Asymptomatic Alzheimer's), is testing whether antibodies to beta-amyloid can reduce the risk of Alzheimer's disease in older people (ages 44 to 2) at high risk for the disease. The A4 trial is being conducted by the Alzheimer's Disease Cooperative Study.  Though research is still evolving, evidence is strong that people can reduce their risk by making key lifestyle changes, including participating in regular activity and maintaining good heart health. Based on this research, the Alzheimer's Association offers 10 Ways to Love Your Brain -- a collection of tips that can reduce the risk of cognitive decline.  Heart-head connection  New research shows there are things we can do to reduce the risk of mild cognitive impairment and dementia.  Several conditions known to increase the risk of cardiovascular disease -- such as high blood pressure, diabetes and high cholesterol -- also increase the risk of developing Alzheimer's. Some autopsy studies show that as many as 80 percent of individuals with Alzheimer's disease also have cardiovascular disease.  A longstanding question is why some people develop hallmark Alzheimer's plaques and tangles but do not develop the symptoms of Alzheimer's. Vascular disease may help researchers eventually find an answer. Some autopsy studies suggest that plaques and tangles may be present in the brain without causing symptoms of cognitive decline unless the brain also shows evidence of vascular disease. More research is needed to better understand the link between vascular health and Alzheimer's.  Physical exercise and diet Regular physical exercise may be a beneficial strategy to lower the risk of Alzheimer's and vascular dementia. Exercise may directly benefit brain cells by increasing blood and oxygen flow in the brain. Because of its known cardiovascular benefits, a medically approved exercise program is a valuable part of any overall wellness  plan.  Current evidence suggests that heart-healthy eating may also help protect the brain. Heart-healthy eating includes limiting the intake of sugar and saturated fats and making sure to eat plenty of fruits, vegetables, and whole grains. No one diet is best. Two diets that have been studied and may be beneficial are the DASH (Dietary Approaches to Stop Hypertension) diet and the Mediterranean diet. The DASH diet emphasizes vegetables, fruits and fat-free or low-fat dairy products; includes whole grains, fish, poultry, beans, seeds, nuts and vegetable oils; and limits sodium, sweets, sugary beverages and red meats. A Mediterranean diet includes relatively little red meat and emphasizes whole grains, fruits and vegetables, fish and shellfish, and nuts, olive oil and other healthy fats.  Social connections and intellectual activity A number of studies indicate that maintaining strong social connections and keeping mentally active as we age might lower the risk of cognitive decline and Alzheimer's. Experts are not certain about the reason for this association. It may be due to direct mechanisms through which social and mental stimulation strengthen connections between nerve cells in the brain.  Head trauma There appears to be a strong link between future risk of Alzheimer's and serious head trauma, especially when injury involves loss of consciousness. You can help reduce your risk of Alzheimer's by protecting your head.  Wear a seat belt  Use a helmet when participating in sports  Fall-proof your home   What you can do now While research is not yet conclusive, certain lifestyle choices, such as physical activity and diet, may help support brain  health and prevent Alzheimer's. Many of these lifestyle changes have been shown to lower the risk of other diseases, like heart disease and diabetes, which have been linked to Alzheimer's. With few drawbacks and plenty of known benefits, healthy lifestyle  choices can improve your health and possibly protect your brain.  Learn more about brain health. You can help increase our knowledge by considering participation in a clinical study. Our free clinical trial matching services, TrialMatch, can help you find clinical trials in your area that are seeking volunteers.  Understanding prevention research Here are some things to keep in mind about the research underlying much of our current knowledge about possible prevention:  Insights about potentially modifiable risk factors apply to large population groups, not to individuals. Studies can show that factor X is associated with outcome Y, but cannot guarantee that any specific person will have that outcome. As a result, you can do everything right and still have a serious health problem or do everything wrong and live to be 100.  Much of our current evidence comes from large epidemiological studies such as the [place] Aging Study, the Nurses' Health Study, the Adult Changes in Thought Study and the [employer]. These studies explore pre-existing behaviors and use statistical methods to relate those behaviors to health outcomes. This type of study can show an association between a factor and an outcome but cannot prove cause and effect. This is why we describe evidence based on these studies with such language as suggests, may show, might protect, and is associated with.  The gold standard for showing cause and effect is a clinical trial in which participants are randomly assigned to a prevention or risk management strategy or a control group. Researchers follow the two groups over time to see if their outcomes differ significantly.  It is unlikely that some prevention or risk management strategies will ever be tested in randomized trials for ethical or practical reasons. One example is exercise. Definitively testing the impact of exercise on Alzheimer's risk would require a huge  trial enrolling thousands of people and following them for many years. The expense and logistics of such a trial would be prohibitive, and it would require some people to go without exercise, a known health benefit.     Mindfulness-Based Stress Reduction: What to Know Mindfulness-based stress reduction (MBSR) is a mindfulness meditation program that normally takes place over 8 weeks. It usually includes weekly group classes and daily exercises to do at home. What are the benefits of MBSR? Mindfulness meditation therapies, like MBSR, can change a person's brain and body in good ways, and make them healthier. MBSR can have many benefits, such as: Helping to lower stress hormones. Decreasing symptoms or helping to deal with symptoms of different conditions, like: Anxiety, which is feeling worried or nervous. Long-lasting pain. This is pain that lasts more than 3 months. Stress and worry. Trouble sleeping. Headaches, like migraines and tension headaches. Irritable bowel syndrome. Helping to handle stress from things you can't control, like: Long-term illnesses, especially if you have a lot of pain or other difficult symptoms. Big life events. Stress at work. Stress from taking care of someone else. Types of MBSR exercises Mindfulness. This is a common type of meditation. Meditation. It helps you focus your mind to feel calm and happy. It has two main parts: paying attention and accepting. Paying attention means focusing on what is happening right now. This usually means noticing your breathing, your thoughts, how your body feels, and your  emotions. Accepting means noticing these feelings and sensations without judging them. Instead of reacting to these thoughts or feelings, you just observe them and let them pass. MSBR exercises include: Body scanning. This is a mindfulness exercise where you pay attention to how different parts of your body feel. You can do this while lying down or sitting  up. Sitting meditations. In this exercise, you focus on something like your breathing. When your mind starts to wander, gently bring it back to your breath. Keep doing this every time you notice your mind wandering. Mindful movements. This exercise involves moving and stretching your body slowly while paying attention to how it feels. Mindful Tasks. This means paying attention to how your body feels while doing things like walking or eating. Follow these instructions at home:  Find an in-person MBSR program or find a program that is online. Find a podcast or recording that provides guidance for MSBR exercises. Look for a therapist who knows how to use MBSR. Follow your treatment plan as told by your health care provider. This may include taking regular medicines and making changes to your diet or lifestyle. Where to find more information You can find more information about MBSR from: Your provider. Community-based meditation centers or programs. American Psychological Association at http://forbes-duran.com/. This information is not intended to replace advice given to you by your health care provider. Make sure you discuss any questions you have with your health care provider. Document Revised: 10/21/2023 Document Reviewed: 10/21/2023 Elsevier Patient Education  2025 Else[employer] Inc.  Preventing Caregiver Burnout This video will help you recognize caregiver burnout and describe ways to manage the stress that can come with caring for someone with a chronic illness. To view the content, go to this web address: [email or web address]  This video will expire on: 08/11/2025. If you need access to this video following this date, please reach out to the healthcare provider who assigned it to you. This information is not intended to replace advice given to you by your health care provider. Make sure you discuss any questions you have with your health care  provider. Elsevier Patient Education  2024 Elsevier Inc.Management of Memory Problems  There are some general things you can do to help manage your memory problems.  Your memory may not in fact recover, but by using techniques and strategies you will be able to manage your memory difficulties better.  1)  Establish a routine. Try to establish and then stick to a regular routine.  By doing this, you will get used to what to expect and you will reduce the need to rely on your memory.  Also, try to do things at the same time of day, such as taking your medication or checking your calendar first thing in the morning. Think about think that you can do as a part of a regular routine and make a list.  Then enter them into a daily planner to remind you.  This will help you establish a routine.  2)  Organize your environment. Organize your environment so that it is uncluttered.  Decrease visual stimulation.  Place everyday items such as keys or cell phone in the same place every day (ie.  Basket next to front door) Use post it notes with a brief message to yourself (ie. Turn off light, lock the door) Use labels to indicate where things go (ie. Which cupboards are for food, dishes, etc.) Keep a notepad and pen by the telephone to take messages  3)  Memory  Aids A diary or journal/notebook/daily planner Making a list (shopping list, chore list, to do list that needs to be done) Using an alarm as a reminder (kitchen timer or cell phone alarm) Using cell phone to store information (Notes, Calendar, Reminders) Calendar/White board placed in a prominent position Post-it notes  In order for memory aids to be useful, you need to have good habits.  It's no good remembering to make a note in your journal if you don't remember to look in it.  Try setting aside a certain time of day to look in journal.  4)  Improving mood and managing fatigue. There may be other factors that contribute to memory difficulties.   Factors, such as anxiety, depression and tiredness can affect memory. Regular gentle exercise can help improve your mood and give you more energy. Simple relaxation techniques may help relieve symptoms of anxiety Try to get back to completing activities or hobbies you enjoyed doing in the past. Learn to pace yourself through activities to decrease fatigue. Find out about some local support groups where you can share experiences with others. Try and achieve 7-8 hours of sleep at night.Memory Compensation Strategies  Use WARM strategy.  W= write it down  A= associate it  R= repeat it  M= make a mental note  2.   You can keep a Glass Blower/designer.  Use a 3-ring notebook with sections for the following: calendar, important names and phone numbers,  medications, doctors' names/phone numbers, lists/reminders, and a section to journal what you did  each day.   3.    Use a calendar to write appointments down.  4.    Write yourself a schedule for the day.  This can be placed on the calendar or in a separate section of the Memory Notebook.  Keeping a  regular schedule can help memory.  5.    Use medication organizer with sections for each day or morning/evening pills.  You may need help loading it  6.    Keep a basket, or pegboard by the door.  Place items that you need to take out with you in the basket or on the pegboard.  You may also want to  include a message board for reminders.  7.    Use sticky notes.  Place sticky notes with reminders in a place where the task is performed.  For example:  turn off the  stove placed by the stove, lock the door placed on the door at eye level,  take your medications on  the bathroom mirror or by the place where you normally take your medications.  8.    Use alarms/timers.  Use while cooking to remind yourself to check on food or as a reminder to take your medicine, or as a  reminder to make a call, or as a reminder to perform another task, etc.

## 2024-06-27 ENCOUNTER — Encounter: Payer: Self-pay | Admitting: Neurology

## 2024-06-27 DIAGNOSIS — D1809 Hemangioma of other sites: Secondary | ICD-10-CM | POA: Diagnosis not present

## 2024-06-27 DIAGNOSIS — L570 Actinic keratosis: Secondary | ICD-10-CM | POA: Diagnosis not present

## 2024-06-27 DIAGNOSIS — D485 Neoplasm of uncertain behavior of skin: Secondary | ICD-10-CM | POA: Diagnosis not present

## 2024-06-29 ENCOUNTER — Ambulatory Visit: Payer: Self-pay | Admitting: Neurology

## 2024-07-02 LAB — COMPREHENSIVE METABOLIC PANEL WITH GFR
ALT: 27 IU/L (ref 0–44)
AST: 38 IU/L (ref 0–40)
Albumin: 4.6 g/dL (ref 3.8–4.8)
Alkaline Phosphatase: 103 IU/L (ref 47–123)
BUN/Creatinine Ratio: 17 (ref 10–24)
BUN: 19 mg/dL (ref 8–27)
Bilirubin Total: 0.6 mg/dL (ref 0.0–1.2)
CO2: 25 mmol/L (ref 20–29)
Calcium: 10.3 mg/dL — ABNORMAL HIGH (ref 8.6–10.2)
Chloride: 101 mmol/L (ref 96–106)
Creatinine, Ser: 1.09 mg/dL (ref 0.76–1.27)
Globulin, Total: 2.9 g/dL (ref 1.5–4.5)
Glucose: 89 mg/dL (ref 70–99)
Potassium: 5.2 mmol/L (ref 3.5–5.2)
Sodium: 139 mmol/L (ref 134–144)
Total Protein: 7.5 g/dL (ref 6.0–8.5)
eGFR: 69 mL/min/1.73 (ref >59)

## 2024-07-02 LAB — APOE ALZHEIMER'S DISEASE RISK

## 2024-07-02 LAB — CBC WITH DIFFERENTIAL/PLATELET
Basophils Absolute: 0.1 x10E3/uL (ref 0.0–0.2)
Basos: 1 %
EOS (ABSOLUTE): 0.3 x10E3/uL (ref 0.0–0.4)
Eos: 5 %
Hematocrit: 51.7 % — ABNORMAL HIGH (ref 37.5–51.0)
Hemoglobin: 17.1 g/dL (ref 13.0–17.7)
Immature Grans (Abs): 0 x10E3/uL (ref 0.0–0.1)
Immature Granulocytes: 0 %
Lymphocytes Absolute: 1.3 x10E3/uL (ref 0.7–3.1)
Lymphs: 23 %
MCH: 30.9 pg (ref 26.6–33.0)
MCHC: 33.1 g/dL (ref 31.5–35.7)
MCV: 94 fL (ref 79–97)
Monocytes Absolute: 0.4 x10E3/uL (ref 0.1–0.9)
Monocytes: 6 %
Neutrophils Absolute: 3.6 x10E3/uL (ref 1.4–7.0)
Neutrophils: 65 %
Platelets: 235 x10E3/uL (ref 150–450)
RBC: 5.53 x10E6/uL (ref 4.14–5.80)
RDW: 13 % (ref 11.6–15.4)
WBC: 5.7 x10E3/uL (ref 3.4–10.8)

## 2024-07-02 LAB — ATN PROFILE
A -- Beta-amyloid 42/40 Ratio: 0.107 (ref >0.102)
Beta-amyloid 40: 220.16 pg/mL
Beta-amyloid 42: 23.47 pg/mL
N -- NfL, Plasma: 3.72 pg/mL (ref 0.00–9.13)
T -- p-tau181: 1.55 pg/mL — ABNORMAL HIGH (ref 0.00–0.97)

## 2024-07-02 LAB — PROTEIN ELECTROPHORESIS, SERUM
A/G Ratio: 1.2 (ref 0.7–1.7)
Albumin ELP: 4.1 g/dL (ref 2.9–4.4)
Alpha 1: 0.2 g/dL (ref 0.0–0.4)
Alpha 2: 0.7 g/dL (ref 0.4–1.0)
Beta: 1.1 g/dL (ref 0.7–1.3)
Gamma Globulin: 1.4 g/dL (ref 0.4–1.8)
Globulin, Total: 3.4 g/dL (ref 2.2–3.9)

## 2024-07-02 LAB — TSH+FREE T4
Free T4: 1.05 ng/dL (ref 0.82–1.77)
TSH: 3.25 u[IU]/mL (ref 0.450–4.500)

## 2024-07-02 LAB — SEDIMENTATION RATE: Sed Rate: 5 mm/h (ref 0–30)

## 2024-07-02 LAB — HEPATITIS B SURFACE ANTIGEN: Hepatitis B Surface Ag: NEGATIVE

## 2024-07-02 LAB — ANA W/REFLEX: Anti Nuclear Antibody (ANA): NEGATIVE

## 2024-07-02 LAB — VITAMIN B12: Vitamin B-12: 706 pg/mL (ref 232–1245)

## 2024-07-02 LAB — METHYLMALONIC ACID, SERUM: Methylmalonic Acid: 218 nmol/L (ref 0–378)

## 2024-07-03 NOTE — Progress Notes (Signed)
 Pt reviewed mychart message from Dr Chalice re: results.

## 2024-07-10 ENCOUNTER — Ambulatory Visit
Admission: RE | Admit: 2024-07-10 | Discharge: 2024-07-10 | Disposition: A | Discharge status: Home / Self Care | Source: Ambulatory Visit | Attending: Neurology | Admitting: Neurology

## 2024-07-10 DIAGNOSIS — R413 Other amnesia: Secondary | ICD-10-CM | POA: Diagnosis not present

## 2024-07-10 MED ORDER — GADOPICLENOL 0.5 MMOL/ML IV SOLN
8.0000 mL | Freq: Once | INTRAVENOUS | Status: AC | PRN
  Administered 2024-07-10: 8 mL via INTRAVENOUS

## 2024-07-11 DIAGNOSIS — Z961 Presence of intraocular lens: Secondary | ICD-10-CM | POA: Diagnosis not present

## 2024-07-11 DIAGNOSIS — H35363 Drusen (degenerative) of macula, bilateral: Secondary | ICD-10-CM | POA: Diagnosis not present

## 2024-07-11 DIAGNOSIS — H04123 Dry eye syndrome of bilateral lacrimal glands: Secondary | ICD-10-CM | POA: Diagnosis not present

## 2024-07-17 DIAGNOSIS — Z23 Encounter for immunization: Secondary | ICD-10-CM | POA: Diagnosis not present

## 2024-08-30 ENCOUNTER — Other Ambulatory Visit: Payer: Self-pay | Admitting: Internal Medicine

## 2024-08-30 DIAGNOSIS — R9389 Abnormal findings on diagnostic imaging of other specified body structures: Secondary | ICD-10-CM

## 2024-09-25 ENCOUNTER — Other Ambulatory Visit

## 2024-09-28 ENCOUNTER — Ambulatory Visit
Admission: RE | Admit: 2024-09-28 | Discharge: 2024-09-28 | Disposition: A | Discharge status: Home / Self Care | Source: Ambulatory Visit | Attending: Internal Medicine | Admitting: Internal Medicine

## 2024-09-28 DIAGNOSIS — R9389 Abnormal findings on diagnostic imaging of other specified body structures: Secondary | ICD-10-CM

## 2024-10-26 ENCOUNTER — Ambulatory Visit: Admitting: Neurology

## 2024-11-09 ENCOUNTER — Ambulatory Visit: Admitting: Neurology
